# Patient Record
Sex: Female | Born: 1980 | Race: Black or African American | Hispanic: No | Marital: Single | Smoking: Never smoker
Health system: Southern US, Community
[De-identification: ages and names within clinical notes are randomized; demographics above are authoritative.]

## PROBLEM LIST (undated history)

## (undated) DIAGNOSIS — Z973 Presence of spectacles and contact lenses: Secondary | ICD-10-CM

## (undated) DIAGNOSIS — Z87410 Personal history of cervical dysplasia: Secondary | ICD-10-CM

## (undated) DIAGNOSIS — N920 Excessive and frequent menstruation with regular cycle: Secondary | ICD-10-CM

## (undated) DIAGNOSIS — D649 Anemia, unspecified: Secondary | ICD-10-CM

## (undated) DIAGNOSIS — D250 Submucous leiomyoma of uterus: Secondary | ICD-10-CM

## (undated) HISTORY — PX: DILATION AND CURETTAGE OF UTERUS: SHX78

---

## 2006-08-04 ENCOUNTER — Ambulatory Visit: Payer: Self-pay | Admitting: Family Medicine

## 2007-05-23 ENCOUNTER — Ambulatory Visit (HOSPITAL_COMMUNITY): Admission: RE | Admit: 2007-05-23 | Discharge: 2007-05-23 | Payer: Self-pay | Admitting: Obstetrics & Gynecology

## 2007-05-25 ENCOUNTER — Encounter: Payer: Self-pay | Admitting: Obstetrics & Gynecology

## 2007-05-25 ENCOUNTER — Ambulatory Visit (HOSPITAL_COMMUNITY): Admission: AD | Admit: 2007-05-25 | Discharge: 2007-05-25 | Payer: Self-pay | Admitting: Obstetrics

## 2007-08-04 ENCOUNTER — Ambulatory Visit (HOSPITAL_COMMUNITY): Admission: RE | Admit: 2007-08-04 | Discharge: 2007-08-04 | Payer: Self-pay | Admitting: Obstetrics & Gynecology

## 2007-10-12 ENCOUNTER — Ambulatory Visit (HOSPITAL_COMMUNITY): Admission: RE | Admit: 2007-10-12 | Discharge: 2007-10-12 | Payer: Self-pay | Admitting: Obstetrics & Gynecology

## 2008-03-10 ENCOUNTER — Inpatient Hospital Stay (HOSPITAL_COMMUNITY): Admission: AD | Admit: 2008-03-10 | Discharge: 2008-03-12 | Payer: Self-pay | Admitting: Obstetrics & Gynecology

## 2010-12-30 NOTE — Op Note (Signed)
NAMECHAUNICE, OBIE NO.:  [id number]   MEDICAL RECORD NO.:  [id number]          PATIENT TYPE:  AMB   LOCATION:  SDC                           FACILITY:  [facility]   PHYSICIAN:  Roseanna Rainbow, M.D.DATE OF BIRTH:  [birth year]   DATE OF PROCEDURE:  05/25/2007  DATE OF DISCHARGE:                               OPERATIVE REPORT   PREOPERATIVE DIAGNOSIS:  Incomplete abortion.   POSTOPERATIVE DIAGNOSIS:  Incomplete abortion.   PROCEDURE:  Suction dilatation and curettage.   SURGEON:  Antionette Char, M.D.   ANESTHESIA:  Managed anesthesia care, paracervical block.   Estimated blood loss 50 mL.   COMPLICATIONS:  None.   PATHOLOGY:  Products of conception.   PROCEDURE:  The patient was taken to the operating room with an IV  running.  She was placed in the dorsal lithotomy position and prepped  and draped in the usual sterile fashion.  After a time-out had been  completed, a bivalve speculum was placed in the patient's vagina.  The  anterior lip of the cervix was infiltrated with 2 mL of 1% lidocaine.  A  single-tooth tenaculum was then applied to this location.  4 mL of 1%  lidocaine were then injected at 4 and 7 o'clock to produce a  paracervical block.  The cervix was dilated.  An 7-mm suction curette  was then advanced into the uterine fundus.  The curette was activated  and rotated to evacuate the products of conception.  A sharp curettage  was performed.  A gritty texture was noted.  There were some  irregularities palpable at the time of sharp curettage consistent with  likely submucous myomas.  A final pass was made with the suction  curette.  After the single-tooth tenaculum was removed.  There was  bleeding noted from the tenaculum site on the cervix.  One area did not  respond to the application of silver nitrate and pressure.  Figure-of-  eight suture of 3-0 chromic was then placed.  Adequate hemostasis was  noted.  The speculum was then  removed.  At the close of the procedure  the instrument and pack counts were said to be correct x2.  The patient  was taken to the PACU awake and in stable condition.      Roseanna Rainbow, M.D.  Electronically Signed     LAJ/MEDQ  D:  05/25/2007  T:  05/26/2007  Job:  [id number]

## 2011-05-15 LAB — CBC
HCT: 28.4 — ABNORMAL LOW
HCT: 37.2
Hemoglobin: 12.9
Hemoglobin: 9.8 — ABNORMAL LOW
MCV: 100.8 — ABNORMAL HIGH
Platelets: 146 — ABNORMAL LOW
Platelets: 175
WBC: 15 — ABNORMAL HIGH

## 2011-05-15 LAB — RPR: RPR Ser Ql: NONREACTIVE

## 2011-05-28 LAB — CBC
MCHC: 34.9
Platelets: 253
RDW: 12.9

## 2012-12-05 ENCOUNTER — Other Ambulatory Visit (HOSPITAL_COMMUNITY)
Admission: RE | Admit: 2012-12-05 | Discharge: 2012-12-05 | Disposition: A | Discharge status: Home / Self Care | Payer: No Typology Code available for payment source | Source: Ambulatory Visit | Attending: Family Medicine | Admitting: Family Medicine

## 2012-12-05 ENCOUNTER — Other Ambulatory Visit: Payer: Self-pay | Admitting: Family Medicine

## 2012-12-05 DIAGNOSIS — Z113 Encounter for screening for infections with a predominantly sexual mode of transmission: Secondary | ICD-10-CM | POA: Insufficient documentation

## 2012-12-05 DIAGNOSIS — Z1151 Encounter for screening for human papillomavirus (HPV): Secondary | ICD-10-CM | POA: Insufficient documentation

## 2012-12-05 DIAGNOSIS — Z1231 Encounter for screening mammogram for malignant neoplasm of breast: Secondary | ICD-10-CM

## 2012-12-05 DIAGNOSIS — Z803 Family history of malignant neoplasm of breast: Secondary | ICD-10-CM

## 2012-12-05 DIAGNOSIS — Z124 Encounter for screening for malignant neoplasm of cervix: Secondary | ICD-10-CM | POA: Insufficient documentation

## 2012-12-09 ENCOUNTER — Ambulatory Visit
Admission: RE | Admit: 2012-12-09 | Discharge: 2012-12-09 | Disposition: A | Discharge status: Home / Self Care | Payer: No Typology Code available for payment source | Source: Ambulatory Visit | Attending: Family Medicine | Admitting: Family Medicine

## 2012-12-09 DIAGNOSIS — Z1231 Encounter for screening mammogram for malignant neoplasm of breast: Secondary | ICD-10-CM

## 2012-12-09 DIAGNOSIS — Z803 Family history of malignant neoplasm of breast: Secondary | ICD-10-CM

## 2014-02-13 ENCOUNTER — Other Ambulatory Visit: Payer: Self-pay | Admitting: Family Medicine

## 2014-02-13 ENCOUNTER — Other Ambulatory Visit (HOSPITAL_COMMUNITY)
Admission: RE | Admit: 2014-02-13 | Discharge: 2014-02-13 | Disposition: A | Discharge status: Home / Self Care | Payer: No Typology Code available for payment source | Source: Ambulatory Visit | Attending: Family Medicine | Admitting: Family Medicine

## 2014-02-13 DIAGNOSIS — Z1151 Encounter for screening for human papillomavirus (HPV): Secondary | ICD-10-CM | POA: Insufficient documentation

## 2014-02-13 DIAGNOSIS — Z124 Encounter for screening for malignant neoplasm of cervix: Secondary | ICD-10-CM | POA: Insufficient documentation

## 2014-02-15 LAB — CYTOLOGY - PAP

## 2014-12-18 ENCOUNTER — Ambulatory Visit (INDEPENDENT_AMBULATORY_CARE_PROVIDER_SITE_OTHER): Payer: 59 | Admitting: Licensed Clinical Social Worker

## 2014-12-18 DIAGNOSIS — F4322 Adjustment disorder with anxiety: Secondary | ICD-10-CM | POA: Diagnosis not present

## 2014-12-25 ENCOUNTER — Ambulatory Visit (INDEPENDENT_AMBULATORY_CARE_PROVIDER_SITE_OTHER): Payer: 59 | Admitting: Licensed Clinical Social Worker

## 2014-12-25 DIAGNOSIS — F4323 Adjustment disorder with mixed anxiety and depressed mood: Secondary | ICD-10-CM | POA: Diagnosis not present

## 2015-01-08 ENCOUNTER — Ambulatory Visit (INDEPENDENT_AMBULATORY_CARE_PROVIDER_SITE_OTHER): Payer: 59 | Admitting: Licensed Clinical Social Worker

## 2015-01-08 DIAGNOSIS — F4322 Adjustment disorder with anxiety: Secondary | ICD-10-CM

## 2015-01-23 ENCOUNTER — Ambulatory Visit: Payer: 59 | Admitting: Licensed Clinical Social Worker

## 2015-01-28 ENCOUNTER — Ambulatory Visit: Payer: 59 | Admitting: Licensed Clinical Social Worker

## 2015-02-19 ENCOUNTER — Ambulatory Visit (INDEPENDENT_AMBULATORY_CARE_PROVIDER_SITE_OTHER): Payer: Commercial Indemnity | Admitting: Licensed Clinical Social Worker

## 2015-02-19 DIAGNOSIS — F4322 Adjustment disorder with anxiety: Secondary | ICD-10-CM | POA: Diagnosis not present

## 2015-05-08 ENCOUNTER — Other Ambulatory Visit: Payer: Self-pay

## 2015-05-08 DIAGNOSIS — Z803 Family history of malignant neoplasm of breast: Secondary | ICD-10-CM

## 2015-05-08 DIAGNOSIS — Z1231 Encounter for screening mammogram for malignant neoplasm of breast: Secondary | ICD-10-CM

## 2015-05-14 ENCOUNTER — Ambulatory Visit
Admission: RE | Admit: 2015-05-14 | Discharge: 2015-05-14 | Disposition: A | Discharge status: Home / Self Care | Payer: Commercial Indemnity | Source: Ambulatory Visit

## 2015-05-14 DIAGNOSIS — Z803 Family history of malignant neoplasm of breast: Secondary | ICD-10-CM

## 2015-05-14 DIAGNOSIS — Z1231 Encounter for screening mammogram for malignant neoplasm of breast: Secondary | ICD-10-CM

## 2015-05-16 ENCOUNTER — Other Ambulatory Visit: Payer: Self-pay | Admitting: Family Medicine

## 2015-05-16 DIAGNOSIS — R928 Other abnormal and inconclusive findings on diagnostic imaging of breast: Secondary | ICD-10-CM

## 2015-05-22 ENCOUNTER — Other Ambulatory Visit: Payer: Self-pay

## 2015-05-28 ENCOUNTER — Ambulatory Visit
Admission: RE | Admit: 2015-05-28 | Discharge: 2015-05-28 | Disposition: A | Discharge status: Home / Self Care | Payer: Managed Care, Other (non HMO) | Source: Ambulatory Visit | Attending: Family Medicine | Admitting: Family Medicine

## 2015-05-28 DIAGNOSIS — R928 Other abnormal and inconclusive findings on diagnostic imaging of breast: Secondary | ICD-10-CM

## 2015-09-10 ENCOUNTER — Other Ambulatory Visit: Payer: Self-pay | Admitting: Family Medicine

## 2015-09-11 ENCOUNTER — Other Ambulatory Visit: Payer: Self-pay | Admitting: Family Medicine

## 2015-09-11 DIAGNOSIS — N920 Excessive and frequent menstruation with regular cycle: Secondary | ICD-10-CM

## 2015-09-16 ENCOUNTER — Ambulatory Visit
Admission: RE | Admit: 2015-09-16 | Discharge: 2015-09-16 | Disposition: A | Discharge status: Home / Self Care | Payer: Managed Care, Other (non HMO) | Source: Ambulatory Visit | Attending: Family Medicine | Admitting: Family Medicine

## 2015-09-16 DIAGNOSIS — N920 Excessive and frequent menstruation with regular cycle: Secondary | ICD-10-CM

## 2016-07-13 ENCOUNTER — Other Ambulatory Visit: Payer: Self-pay | Admitting: Family Medicine

## 2016-07-13 DIAGNOSIS — Z1231 Encounter for screening mammogram for malignant neoplasm of breast: Secondary | ICD-10-CM

## 2016-07-21 ENCOUNTER — Ambulatory Visit
Admission: RE | Admit: 2016-07-21 | Discharge: 2016-07-21 | Disposition: A | Discharge status: Home / Self Care | Payer: Managed Care, Other (non HMO) | Source: Ambulatory Visit | Attending: Family Medicine | Admitting: Family Medicine

## 2016-07-21 DIAGNOSIS — Z1231 Encounter for screening mammogram for malignant neoplasm of breast: Secondary | ICD-10-CM

## 2017-05-05 ENCOUNTER — Other Ambulatory Visit (HOSPITAL_COMMUNITY)
Admission: RE | Admit: 2017-05-05 | Discharge: 2017-05-05 | Disposition: A | Discharge status: Home / Self Care | Payer: Managed Care, Other (non HMO) | Source: Ambulatory Visit | Attending: Family Medicine | Admitting: Family Medicine

## 2017-05-05 ENCOUNTER — Other Ambulatory Visit: Payer: Self-pay | Admitting: Family Medicine

## 2017-05-05 DIAGNOSIS — Z124 Encounter for screening for malignant neoplasm of cervix: Secondary | ICD-10-CM | POA: Diagnosis not present

## 2017-05-07 LAB — CYTOLOGY - PAP
Adequacy: ABSENT
Chlamydia: NEGATIVE
Diagnosis: NEGATIVE
HPV: DETECTED — AB
Neisseria Gonorrhea: NEGATIVE

## 2018-08-08 ENCOUNTER — Other Ambulatory Visit (HOSPITAL_COMMUNITY)
Admission: RE | Admit: 2018-08-08 | Discharge: 2018-08-08 | Disposition: A | Discharge status: Home / Self Care | Payer: Managed Care, Other (non HMO) | Source: Ambulatory Visit | Attending: Obstetrics and Gynecology | Admitting: Obstetrics and Gynecology

## 2018-08-08 ENCOUNTER — Other Ambulatory Visit: Payer: Self-pay | Admitting: Obstetrics and Gynecology

## 2018-08-08 DIAGNOSIS — Z01411 Encounter for gynecological examination (general) (routine) with abnormal findings: Secondary | ICD-10-CM | POA: Insufficient documentation

## 2018-08-12 LAB — CYTOLOGY - PAP
DIAGNOSIS: NEGATIVE
Diagnosis: REACTIVE
HPV (WINDOPATH): NOT DETECTED

## 2019-01-03 ENCOUNTER — Other Ambulatory Visit: Payer: Self-pay | Admitting: Family Medicine

## 2019-03-07 ENCOUNTER — Ambulatory Visit: Payer: Managed Care, Other (non HMO) | Admitting: Family Medicine

## 2019-03-07 ENCOUNTER — Other Ambulatory Visit: Payer: Self-pay

## 2019-03-07 DIAGNOSIS — Z0289 Encounter for other administrative examinations: Secondary | ICD-10-CM

## 2019-03-09 ENCOUNTER — Other Ambulatory Visit: Payer: Self-pay

## 2019-03-09 ENCOUNTER — Ambulatory Visit (INDEPENDENT_AMBULATORY_CARE_PROVIDER_SITE_OTHER): Payer: Managed Care, Other (non HMO) | Admitting: Family Medicine

## 2019-03-09 ENCOUNTER — Encounter: Payer: Self-pay | Admitting: Family Medicine

## 2019-03-09 DIAGNOSIS — Z7689 Persons encountering health services in other specified circumstances: Secondary | ICD-10-CM

## 2019-03-09 DIAGNOSIS — L669 Cicatricial alopecia, unspecified: Secondary | ICD-10-CM | POA: Diagnosis not present

## 2019-03-09 DIAGNOSIS — D219 Benign neoplasm of connective and other soft tissue, unspecified: Secondary | ICD-10-CM

## 2019-03-09 DIAGNOSIS — E7800 Pure hypercholesterolemia, unspecified: Secondary | ICD-10-CM

## 2019-03-09 NOTE — Progress Notes (Signed)
Virtual Visit via Video Note  I connected with Dominique Hancock on 03/09/19 at  2:00 PM EDT by a video enabled telemedicine application and verified that I am speaking with the correct person using two identifiers.  Location patient: home Location provider:work or home office Persons participating in the virtual visit: patient, provider  I discussed the limitations of evaluation and management by telemedicine and the availability of in person appointments. The patient expressed understanding and agreed to proceed.   HPI: Pt is a 38 yo female with pmh sig for alopecia and fibroids.  Pt was previously seen at [facility]    Fibroids -seen by Dr. 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 -seen by Dermatolgy, Dr. Leonie Green at [facility] -topical ointment and steroid injections  Allergies: NKDA  Past Surgical hx: D&C 2008 Root canal  Social Hx: Pt works in [employer].  Has 2 kids ages 55 and 37.  Pt's 83 yo has a birthday this wknd.  Pt denies tobacco and drug use.  Pt endorses EtOH use.  Family Hx: Mom-breast cancer dx'd at 41,  PreDM, HTN Maternal aunt- triple negative breast cancer MGM-DM MGF- testicular cancer, HTN  ROS: See pertinent positives and negatives per HPI.  No past medical history on file.  No family history on file.  No current outpatient medications on file.  EXAM:  VITALS per patient if applicable: RR between 46-50 bpm  GENERAL: alert, oriented, appears well and in no acute distress  HEENT: atraumatic, conjunctiva clear, no obvious abnormalities on inspection of external nose and ears  NECK: normal movements of the head and neck  LUNGS: on inspection no signs of respiratory distress, breathing rate appears normal, no obvious gross SOB, gasping or  wheezing  CV: no obvious cyanosis  MS: moves all visible extremities without noticeable abnormality  PSYCH/NEURO: pleasant and cooperative, no obvious depression or anxiety, speech and thought processing grossly intact  ASSESSMENT AND PLAN:  Discussed the following assessment and plan:  Fibroids  -continue iron supplements during menses -pt encouraged to f/u with OB/Gyn, Dr. Garwin Brothers  Elevated cholesterol -discussed lifestyle modification -will check cholesterol at next OFV  Central centrifugal scarring alopecia  -continue current treatments with topical cream and steroid injections -continue f/u with Dr. Leonie Green, [facility]  Encounter to establish care  -We reviewed the PMH, PSH, FH, SH, Meds and Allergies. -We provided refills for any medications we will prescribe as needed. -We addressed current concerns per orders and patient instructions. -We have asked for records for pertinent exams, studies, vaccines and notes from previous providers. -We have advised patient to follow up per instructions below.  F/u prn for CPE in the next few months I discussed the assessment and treatment plan with the patient. The patient was provided an opportunity to ask questions and all were answered. The patient agreed with the plan and demonstrated an understanding of the instructions.   The patient was advised to call back or seek an in-person evaluation if the symptoms worsen or if the condition fails to improve as anticipated.   Billie Ruddy, MD

## 2019-03-15 ENCOUNTER — Other Ambulatory Visit: Payer: Self-pay | Admitting: Obstetrics and Gynecology

## 2019-04-13 ENCOUNTER — Other Ambulatory Visit: Payer: Self-pay

## 2019-04-13 ENCOUNTER — Encounter (HOSPITAL_COMMUNITY)
Admission: RE | Admit: 2019-04-13 | Discharge: 2019-04-13 | Disposition: A | Discharge status: Home / Self Care | Payer: Managed Care, Other (non HMO) | Source: Ambulatory Visit | Attending: Obstetrics and Gynecology | Admitting: Obstetrics and Gynecology

## 2019-04-13 ENCOUNTER — Other Ambulatory Visit (HOSPITAL_COMMUNITY)
Admission: RE | Admit: 2019-04-13 | Discharge: 2019-04-13 | Disposition: A | Discharge status: Home / Self Care | Payer: Managed Care, Other (non HMO) | Source: Ambulatory Visit | Attending: Obstetrics and Gynecology | Admitting: Obstetrics and Gynecology

## 2019-04-13 ENCOUNTER — Encounter (HOSPITAL_BASED_OUTPATIENT_CLINIC_OR_DEPARTMENT_OTHER): Payer: Self-pay | Admitting: *Deleted

## 2019-04-13 DIAGNOSIS — Z20828 Contact with and (suspected) exposure to other viral communicable diseases: Secondary | ICD-10-CM | POA: Diagnosis not present

## 2019-04-13 DIAGNOSIS — N920 Excessive and frequent menstruation with regular cycle: Secondary | ICD-10-CM | POA: Diagnosis present

## 2019-04-13 DIAGNOSIS — D250 Submucous leiomyoma of uterus: Secondary | ICD-10-CM | POA: Diagnosis present

## 2019-04-13 DIAGNOSIS — N840 Polyp of corpus uteri: Secondary | ICD-10-CM | POA: Diagnosis not present

## 2019-04-13 DIAGNOSIS — Z01812 Encounter for preprocedural laboratory examination: Secondary | ICD-10-CM | POA: Insufficient documentation

## 2019-04-13 LAB — CBC
HCT: 32.2 % — ABNORMAL LOW (ref 36.0–46.0)
Hemoglobin: 9.9 g/dL — ABNORMAL LOW (ref 12.0–15.0)
MCH: 26.8 pg (ref 26.0–34.0)
MCHC: 30.7 g/dL (ref 30.0–36.0)
MCV: 87.3 fL (ref 80.0–100.0)
Platelets: 303 10*3/uL (ref 150–400)
RBC: 3.69 MIL/uL — ABNORMAL LOW (ref 3.87–5.11)
RDW: 16.3 % — ABNORMAL HIGH (ref 11.5–15.5)
WBC: 8.8 10*3/uL (ref 4.0–10.5)
nRBC: 0 % (ref 0.0–0.2)

## 2019-04-13 LAB — SARS CORONAVIRUS 2 (TAT 6-24 HRS): SARS Coronavirus 2: NEGATIVE

## 2019-04-13 NOTE — Progress Notes (Signed)
Spoke w/ pt via phone for pre-op interview.  Npo after mn w/ exception clear liquids until 0730 then nothing by mouth, pt verbalized understanding.  Arrive at [facility].  Needs urine preg.  Current cbc result dated 04-13-2019 in chart and epic.

## 2019-04-17 ENCOUNTER — Ambulatory Visit (HOSPITAL_BASED_OUTPATIENT_CLINIC_OR_DEPARTMENT_OTHER): Payer: Managed Care, Other (non HMO) | Admitting: Anesthesiology

## 2019-04-17 ENCOUNTER — Other Ambulatory Visit: Payer: Self-pay

## 2019-04-17 ENCOUNTER — Ambulatory Visit (HOSPITAL_BASED_OUTPATIENT_CLINIC_OR_DEPARTMENT_OTHER)
Admission: RE | Admit: 2019-04-17 | Discharge: 2019-04-17 | Disposition: A | Discharge status: Home / Self Care | Payer: Managed Care, Other (non HMO) | Attending: Obstetrics and Gynecology | Admitting: Obstetrics and Gynecology

## 2019-04-17 ENCOUNTER — Encounter (HOSPITAL_BASED_OUTPATIENT_CLINIC_OR_DEPARTMENT_OTHER): Payer: Self-pay | Admitting: *Deleted

## 2019-04-17 ENCOUNTER — Encounter (HOSPITAL_BASED_OUTPATIENT_CLINIC_OR_DEPARTMENT_OTHER)
Admission: RE | Disposition: A | Discharge status: Home / Self Care | Payer: Self-pay | Source: Home / Self Care | Attending: Obstetrics and Gynecology

## 2019-04-17 DIAGNOSIS — N840 Polyp of corpus uteri: Secondary | ICD-10-CM | POA: Insufficient documentation

## 2019-04-17 DIAGNOSIS — N920 Excessive and frequent menstruation with regular cycle: Secondary | ICD-10-CM | POA: Diagnosis not present

## 2019-04-17 DIAGNOSIS — D250 Submucous leiomyoma of uterus: Secondary | ICD-10-CM | POA: Insufficient documentation

## 2019-04-17 HISTORY — DX: Anemia, unspecified: D64.9

## 2019-04-17 HISTORY — DX: Submucous leiomyoma of uterus: D25.0

## 2019-04-17 HISTORY — DX: Excessive and frequent menstruation with regular cycle: N92.0

## 2019-04-17 HISTORY — DX: Presence of spectacles and contact lenses: Z97.3

## 2019-04-17 HISTORY — PX: DILATATION & CURETTAGE/HYSTEROSCOPY WITH MYOSURE: SHX6511

## 2019-04-17 LAB — POCT PREGNANCY, URINE: Preg Test, Ur: NEGATIVE

## 2019-04-17 SURGERY — DILATATION & CURETTAGE/HYSTEROSCOPY WITH MYOSURE
Anesthesia: General

## 2019-04-17 MED ORDER — FENTANYL CITRATE (PF) 100 MCG/2ML IJ SOLN
INTRAMUSCULAR | Status: AC
  Filled 2019-04-17: qty 2

## 2019-04-17 MED ORDER — KETOROLAC TROMETHAMINE 30 MG/ML IJ SOLN
INTRAMUSCULAR | Status: DC | PRN
  Administered 2019-04-17: 30 mg via INTRAVENOUS

## 2019-04-17 MED ORDER — IBUPROFEN 800 MG PO TABS: 800.0000 mg | ORAL_TABLET | Freq: Three times a day (TID) | ORAL | 0 refills | Status: DC | PRN

## 2019-04-17 MED ORDER — MIDAZOLAM HCL 2 MG/2ML IJ SOLN
INTRAMUSCULAR | Status: AC
  Filled 2019-04-17: qty 2

## 2019-04-17 MED ORDER — OXYCODONE HCL 5 MG/5ML PO SOLN
5.0000 mg | Freq: Once | ORAL | Status: AC | PRN
  Filled 2019-04-17: qty 5

## 2019-04-17 MED ORDER — FENTANYL CITRATE (PF) 100 MCG/2ML IJ SOLN
INTRAMUSCULAR | Status: DC | PRN
  Administered 2019-04-17: 25 ug via INTRAVENOUS
  Administered 2019-04-17: 50 ug via INTRAVENOUS
  Administered 2019-04-17: 25 ug via INTRAVENOUS

## 2019-04-17 MED ORDER — PROMETHAZINE HCL 25 MG/ML IJ SOLN
6.2500 mg | INTRAMUSCULAR | Status: DC | PRN
  Filled 2019-04-17: qty 1

## 2019-04-17 MED ORDER — KETOROLAC TROMETHAMINE 30 MG/ML IJ SOLN
INTRAMUSCULAR | Status: AC
  Filled 2019-04-17: qty 1

## 2019-04-17 MED ORDER — DEXAMETHASONE SODIUM PHOSPHATE 4 MG/ML IJ SOLN
INTRAMUSCULAR | Status: DC | PRN
  Administered 2019-04-17: 5 mg via INTRAVENOUS

## 2019-04-17 MED ORDER — SODIUM CHLORIDE 0.9 % IR SOLN
Status: DC | PRN
  Administered 2019-04-17: 3000 mL

## 2019-04-17 MED ORDER — PROPOFOL 10 MG/ML IV BOLUS
INTRAVENOUS | Status: AC
  Filled 2019-04-17: qty 20

## 2019-04-17 MED ORDER — ONDANSETRON HCL 4 MG/2ML IJ SOLN
INTRAMUSCULAR | Status: DC | PRN
  Administered 2019-04-17: 4 mg via INTRAVENOUS

## 2019-04-17 MED ORDER — OXYCODONE HCL 5 MG PO TABS
5.0000 mg | ORAL_TABLET | Freq: Once | ORAL | Status: AC | PRN
  Administered 2019-04-17: 5 mg via ORAL
  Filled 2019-04-17: qty 1

## 2019-04-17 MED ORDER — LACTATED RINGERS IV SOLN
INTRAVENOUS | Status: DC
  Administered 2019-04-17: 13:00:00 via INTRAVENOUS
  Filled 2019-04-17: qty 1000

## 2019-04-17 MED ORDER — OXYCODONE HCL 5 MG PO TABS
ORAL_TABLET | ORAL | Status: AC
  Filled 2019-04-17: qty 1

## 2019-04-17 MED ORDER — DEXAMETHASONE SODIUM PHOSPHATE 10 MG/ML IJ SOLN
INTRAMUSCULAR | Status: AC
  Filled 2019-04-17: qty 1

## 2019-04-17 MED ORDER — KETOROLAC TROMETHAMINE 30 MG/ML IJ SOLN
30.0000 mg | Freq: Once | INTRAMUSCULAR | Status: DC | PRN
  Filled 2019-04-17: qty 1

## 2019-04-17 MED ORDER — FENTANYL CITRATE (PF) 100 MCG/2ML IJ SOLN
25.0000 ug | INTRAMUSCULAR | Status: DC | PRN
  Filled 2019-04-17: qty 1

## 2019-04-17 MED ORDER — LIDOCAINE HCL 1 % IJ SOLN
INTRAMUSCULAR | Status: DC | PRN
  Administered 2019-04-17: 40 mg via INTRADERMAL

## 2019-04-17 MED ORDER — LIDOCAINE 2% (20 MG/ML) 5 ML SYRINGE
INTRAMUSCULAR | Status: AC
  Filled 2019-04-17: qty 5

## 2019-04-17 MED ORDER — MIDAZOLAM HCL 5 MG/5ML IJ SOLN
INTRAMUSCULAR | Status: DC | PRN
  Administered 2019-04-17: 2 mg via INTRAVENOUS

## 2019-04-17 MED ORDER — PROPOFOL 10 MG/ML IV BOLUS
INTRAVENOUS | Status: DC | PRN
  Administered 2019-04-17: 180 mg via INTRAVENOUS

## 2019-04-17 SURGICAL SUPPLY — 19 items
CANISTER SUCT 3000ML PPV (MISCELLANEOUS) ×2 IMPLANT
CATH ROBINSON RED A/P 16FR (CATHETERS) ×1 IMPLANT
COVER WAND RF STERILE (DRAPES) ×2 IMPLANT
GAUZE 4X4 16PLY RFD (DISPOSABLE) ×2 IMPLANT
GLOVE BIOGEL PI IND STRL 7.0 (GLOVE) ×1 IMPLANT
GLOVE BIOGEL PI INDICATOR 7.0 (GLOVE) ×1
GLOVE ECLIPSE 6.5 STRL STRAW (GLOVE) ×2 IMPLANT
GOWN STRL REUS W/TWL LRG LVL3 (GOWN DISPOSABLE) ×2 IMPLANT
IV NS IRRIG 3000ML ARTHROMATIC (IV SOLUTION) ×2 IMPLANT
KIT PROCEDURE FLUENT (KITS) ×2 IMPLANT
KIT TURNOVER CYSTO (KITS) ×2 IMPLANT
MYOSURE XL FIBROID (MISCELLANEOUS) ×2
PACK VAGINAL MINOR WOMEN LF (CUSTOM PROCEDURE TRAY) ×2 IMPLANT
PAD OB MATERNITY 4.3X12.25 (PERSONAL CARE ITEMS) ×2 IMPLANT
PAD PREP 24X48 CUFFED NSTRL (MISCELLANEOUS) ×2 IMPLANT
SEAL ROD LENS SCOPE MYOSURE (ABLATOR) ×2 IMPLANT
SYSTEM TISS REMOVAL MYOSURE XL (MISCELLANEOUS) IMPLANT
TOWEL OR 17X26 10 PK STRL BLUE (TOWEL DISPOSABLE) ×2 IMPLANT
WATER STERILE IRR 500ML POUR (IV SOLUTION) ×2 IMPLANT

## 2019-04-17 NOTE — Anesthesia Procedure Notes (Signed)
Procedure Name: LMA Insertion Date/Time: 04/17/2019 1:35 PM Performed by: Garrel Ridgel, CRNA Pre-anesthesia Checklist: Patient identified, Emergency Drugs available, Suction available, Patient being monitored and Timeout performed Patient Re-evaluated:Patient Re-evaluated prior to induction Oxygen Delivery Method: Circle system utilized Preoxygenation: Pre-oxygenation with 100% oxygen Induction Type: IV induction Ventilation: Mask ventilation without difficulty LMA: LMA inserted LMA Size: 3.0 Number of attempts: 1 Dental Injury: Teeth and Oropharynx as per pre-operative assessment

## 2019-04-17 NOTE — Transfer of Care (Signed)
Immediate Anesthesia Transfer of Care Note  Patient: Dominique Hancock  Procedure(s) Performed: DILATATION & CURETTAGE/HYSTEROSCOPY WITH MYOSURE XL (N/A )  Patient Location: PACU  Anesthesia Type:General  Level of Consciousness: awake, alert , oriented and patient cooperative  Airway & Oxygen Therapy: Patient Spontanous Breathing and Patient connected to nasal cannula oxygen  Post-op Assessment: Report given to RN and Post -op Vital signs reviewed and stable  Post vital signs: Reviewed and stable  Last Vitals:  Vitals Value Taken Time  BP 106/62 04/17/19 1415  Temp    Pulse 67 04/17/19 1415  Resp 12 04/17/19 1415  SpO2 100 % 04/17/19 1415  Vitals shown include unvalidated device data.  Last Pain:  Vitals:   04/17/19 1136  TempSrc: Oral         Complications: No apparent anesthesia complications

## 2019-04-17 NOTE — Anesthesia Preprocedure Evaluation (Signed)
Anesthesia Evaluation  Patient identified by MRN, date of birth, ID band Patient awake    Reviewed: Allergy & Precautions, NPO status , Patient's Chart, lab work & pertinent test results  Airway Mallampati: II  TM Distance: >3 FB Neck ROM: Full    Dental no notable dental hx.    Pulmonary neg pulmonary ROS,    Pulmonary exam normal breath sounds clear to auscultation       Cardiovascular negative cardio ROS Normal cardiovascular exam Rhythm:Regular Rate:Normal     Neuro/Psych negative neurological ROS  negative psych ROS   GI/Hepatic negative GI ROS, Neg liver ROS,   Endo/Other  negative endocrine ROS  Renal/GU negative Renal ROS  negative genitourinary   Musculoskeletal negative musculoskeletal ROS (+)   Abdominal   Peds negative pediatric ROS (+)  Hematology negative hematology ROS (+) anemia ,   Anesthesia Other Findings   Reproductive/Obstetrics negative OB ROS                             Anesthesia Physical Anesthesia Plan  ASA: II  Anesthesia Plan: General   Post-op Pain Management:    Induction: Intravenous  PONV Risk Score and Plan: 3 and Ondansetron, Dexamethasone and Treatment may vary due to age or medical condition  Airway Management Planned: LMA  Additional Equipment:   Intra-op Plan:   Post-operative Plan: Extubation in OR  Informed Consent: I have reviewed the patients History and Physical, chart, labs and discussed the procedure including the risks, benefits and alternatives for the proposed anesthesia with the patient or authorized representative who has indicated his/her understanding and acceptance.     Dental advisory given  Plan Discussed with: CRNA and Surgeon  Anesthesia Plan Comments:         Anesthesia Quick Evaluation

## 2019-04-17 NOTE — Discharge Instructions (Signed)
DISCHARGE INSTRUCTIONS: HYSTEROSCOPY  The following instructions have been prepared to help you care for yourself upon your return home.    May take Ibuprofen after 8:00pm today.    Personal hygiene:  Use sanitary pads for vaginal drainage, not tampons.  Shower the day after your procedure.  NO tub baths, pools or Jacuzzis for 2-3 weeks.  Wipe front to back after using the bathroom.  Activity and limitations:  Do NOT drive or operate any equipment for 24 hours. The effects of anesthesia are still present and drowsiness may result.  Do NOT rest in bed all day.  Walking is encouraged.  Walk up and down stairs slowly.  You may resume your normal activity in one to two days or as indicated by your physician. Sexual activity: NO intercourse for at least 2 weeks after the procedure, or as indicated by your Doctor.  Diet: Eat a light meal as desired this evening. You may resume your usual diet tomorrow.  Return to Work: You may resume your work activities in one to two days or as indicated by Marine scientist.  What to expect after your surgery: Expect to have vaginal bleeding/discharge for 2-3 days and spotting for up to 10 days. It is not unusual to have soreness for up to 1-2 weeks. You may have a slight burning sensation when you urinate for the first day. Mild cramps may continue for a couple of days. You may have a regular period in 2-6 weeks.  Call your doctor for any of the following:  Excessive vaginal bleeding or clotting, saturating and changing one pad every hour.  Inability to urinate 6 hours after discharge from hospital.  Pain not relieved by pain medication.  Fever of 100.4 F or greater.  Unusual vaginal discharge or odor.  Return to office _________________Call for an appointment ___________________ Patients signature: ______________________ Nurses signature ________________________    Post Anesthesia Home Care Instructions  Activity: Get  plenty of rest for the remainder of the day. A responsible individual must stay with you for 24 hours following the procedure.  For the next 24 hours, DO NOT: -Drive a car -Paediatric nurse -Drink alcoholic beverages -Take any medication unless instructed by your physician -Make any legal decisions or sign important papers.  Meals: Start with liquid foods such as gelatin or soup. Progress to regular foods as tolerated. Avoid greasy, spicy, heavy foods. If nausea and/or vomiting occur, drink only clear liquids until the nausea and/or vomiting subsides. Call your physician if vomiting continues.  Special Instructions/Symptoms: Your throat may feel dry or sore from the anesthesia or the breathing tube placed in your throat during surgery. If this causes discomfort, gargle with warm salt water. The discomfort should disappear within 24 hours.  If you had a scopolamine patch placed behind your ear for the management of post- operative nausea and/or vomiting:  1. The medication in the patch is effective for 72 hours, after which it should be removed.  Wrap patch in a tissue and discard in the trash. Wash hands thoroughly with soap and water. 2. You may remove the patch earlier than 72 hours if you experience unpleasant side effects which may include dry mouth, dizziness or visual disturbances. 3. Avoid touching the patch. Wash your hands with soap and water after contact with the patch.

## 2019-04-17 NOTE — Brief Op Note (Signed)
04/17/2019  2:23 PM  PATIENT:  Dominique Hancock  38 y.o. female  PRE-OPERATIVE DIAGNOSIS:  Submucosal Fibroid, Menorrhagia  POST-OPERATIVE DIAGNOSIS:  Submucosal Fibroid, Menorrhagia  PROCEDURE:  Diagnostic hysteroscopy, dilation and curettage, hysteroscopic resection of SM fibroid  SURGEON:  Surgeon(s) and Role:    Servando Salina, MD - Primary  PHYSICIAN ASSISTANT:   ASSISTANTS: none   ANESTHESIA:   general FINDINGS; larger posterior LUS SM fibroid, pea size right ant SM fibroid, ? endom polyps, tubal ostia seen EBL:  5 mL   BLOOD ADMINISTERED:none  DRAINS: none   LOCAL MEDICATIONS USED:  NONE  SPECIMEN:  Source of Specimen:  emc with fibroid resection  DISPOSITION OF SPECIMEN:  PATHOLOGY  COUNTS:  YES  TOURNIQUET:  * No tourniquets in log *  DICTATION: .Other Dictation: Dictation Number [id number]  PLAN OF CARE: Discharge to home after PACU  PATIENT DISPOSITION:  PACU - hemodynamically stable.   Delay start of Pharmacological VTE agent (>24hrs) due to surgical blood loss or risk of bleeding: no

## 2019-04-17 NOTE — Anesthesia Postprocedure Evaluation (Signed)
Anesthesia Post Note  Patient: Dominique Hancock  Procedure(s) Performed: DILATATION & CURETTAGE/HYSTEROSCOPY WITH MYOSURE XL (N/A )     Patient location during evaluation: PACU Anesthesia Type: General Level of consciousness: awake and alert Pain management: pain level controlled Vital Signs Assessment: post-procedure vital signs reviewed and stable Respiratory status: spontaneous breathing, nonlabored ventilation, respiratory function stable and patient connected to nasal cannula oxygen Cardiovascular status: blood pressure returned to baseline and stable Postop Assessment: no apparent nausea or vomiting Anesthetic complications: no    Last Vitals:  Vitals:   04/17/19 1136 04/17/19 1415  BP: (!) 109/58 106/62  Pulse: 74 73  Resp: 16 12  Temp: 36.8 C (!) 36.4 C  SpO2: 100% 100%    Last Pain:  Vitals:   04/17/19 1415  TempSrc:   PainSc: 0-No pain                 Timea Breed S

## 2019-04-17 NOTE — H&P (Addendum)
Dominique Hancock is an 38 y.o. female G6 P2 BF presents for surgical management of SM fibroid noted on sonohysterogram done for menorrhagia  Pertinent Gynecological History: Menses: flow is excessive with use of 7 pads or tampons on heaviest days Bleeding: dysfunctional uterine bleeding Contraception: rhythm method DES exposure: denies Blood transfusions: none Sexually transmitted diseases: no past history Previous GYN Procedures: DNC  Last mammogram: n/a Date: n/a Last pap: normal Date: 08/08/2018 OB History: G6, P2   Menstrual History: Menarche age: n/a Patient's last menstrual period was 03/29/2019 (approximate).    Past Medical History:  Diagnosis Date  . Anemia   . Menorrhagia   . Submucous uterine fibroid   . Wears glasses     Past Surgical History:  Procedure Laterality Date  . DILATION AND CURETTAGE OF UTERUS  05-25-2007  @[facility]     History reviewed. No pertinent family history.  Social History:  reports that she has never smoked. She has never used smokeless tobacco. She reports current alcohol use. She reports that she does not use drugs.  Allergies: No Known Allergies  No medications prior to admission.    Review of Systems  All other systems reviewed and are negative.   Height 5\' 5"  (1.651 m), weight 72.6 kg, last menstrual period 03/29/2019. Physical Exam  Constitutional: She is oriented to person, place, and time. She appears well-developed and well-nourished.  Eyes: EOM are normal.  Neck: Neck supple.  Cardiovascular: Regular rhythm.  Respiratory: Effort normal.  GI: Soft.  Genitourinary:    Vagina normal.     Genitourinary Comments: Cervix nl Uterus nodular sl enlarged Adnexa nl   Musculoskeletal: Normal range of motion.  Neurological: She is alert and oriented to person, place, and time.  Skin: Skin is warm and dry.  Psychiatric: She has a normal mood and affect.    No results found for this or any previous visit (from the past 24  hour(s)).  No results found.  Assessment/Plan: Menorrhagia with regular cycles SM fibroid P) dx hysteroscopy, D&C, resection of SM fibroid. Risk of surgery includes infection, bleeding, injury to surrounding organ structures, inability to complete resection in one setting, fluid overload and its mgmt and risk, uterine perforation ( 08/998) and its risk. All ? answered  Dominique Hancock A Dominique Hancock 04/17/2019, 6:25 AM    Up date  I have reexamined pt  no change since last visit

## 2019-04-18 ENCOUNTER — Encounter (HOSPITAL_BASED_OUTPATIENT_CLINIC_OR_DEPARTMENT_OTHER): Payer: Self-pay | Admitting: Obstetrics and Gynecology

## 2019-04-18 NOTE — Op Note (Signed)
Dominique Hancock, Dominique Hancock [facility] MEDICAL RECORD [id number] ACCOUNT [id number] DATE OF BIRTH:[birth year] FACILITY: [facility] LOCATION: [facility] PHYSICIAN:Bindu Docter [name], MD  OPERATIVE REPORT  DATE OF PROCEDURE:  04/17/2019  PREOPERATIVE DIAGNOSES:  Menorrhagia with regular cycles, submucosal fibroid.  PROCEDURE PERFORMED:  Diagnostic hysteroscopy, hysteroscopic resection of submucosal fibroid, dilation and curettage.  POSTOPERATIVE DIAGNOSIS:  Menorrhagia with submucosal fibroids.  ANESTHESIA:  General.  SURGEON:  Servando Salina, MD  ASSISTANT:  None.  DESCRIPTION OF PROCEDURE:  Under adequate general anesthesia, the patient was placed in the dorsal lithotomy position.  She was sterilely prepped and draped in the usual fashion.  The bladder was catheterized, a moderate amount of urine.  Examination  under anesthesia revealed an anteverted uterus.  No adnexal masses could be appreciated.  A bivalve speculum was placed in the vagina.  A single-tooth tenaculum was placed on the anterior lip of the cervix.  The cervix was dilated up to a #23 Pratt  dilator.  A diagnostic hysteroscope was introduced into the uterine cavity.  Both tubal ostia could be seen.  A small anterior submucosal fibroid was noted.  Two other possible endometrial polyps were noted.  The left lateral wall has endometrial thickening, and posteriorly was a large submucosal fibroid.  Using the MyoSure XL resectoscope, the smaller submucosal fibroid and the other polypoid lesions and thickening were removed, and then the submucosal fibroid was resected.  Three-quarters of  the resection was done when the fluid deficit of 2500 resulted in the procedure being stopped.  At that point, all instruments were then removed from the vagina.  SPECIMEN:  Endometrial curetting with fibroid resection sent to pathology.  ESTIMATED BLOOD LOSS:  10 mL.  FLUID DEFICIT:  2430 mL.  COUNTS:  Sponge and instrument count x2 was  correct.  COMPLICATIONS:  None.  DISPOSITION:  The patient tolerated the procedure well and was transferred to the recovery room in stable condition.  LN/NUANCE  D:04/17/2019 T:04/18/2019 JOB:[id number]

## 2019-08-16 ENCOUNTER — Ambulatory Visit: Payer: Managed Care, Other (non HMO) | Attending: Internal Medicine

## 2019-08-16 DIAGNOSIS — Z20822 Contact with and (suspected) exposure to covid-19: Secondary | ICD-10-CM

## 2019-08-17 LAB — NOVEL CORONAVIRUS, NAA: SARS-CoV-2, NAA: NOT DETECTED

## 2019-12-15 ENCOUNTER — Other Ambulatory Visit: Payer: Self-pay

## 2019-12-18 ENCOUNTER — Ambulatory Visit (INDEPENDENT_AMBULATORY_CARE_PROVIDER_SITE_OTHER): Payer: Managed Care, Other (non HMO) | Admitting: Family Medicine

## 2019-12-18 ENCOUNTER — Encounter: Payer: Self-pay | Admitting: Family Medicine

## 2019-12-18 VITALS — BP 108/64 | HR 88 | Temp 97.5°F | Wt 159.0 lb

## 2019-12-18 DIAGNOSIS — M25461 Effusion, right knee: Secondary | ICD-10-CM | POA: Diagnosis not present

## 2019-12-18 MED ORDER — IBUPROFEN 800 MG PO TABS: 800.0000 mg | ORAL_TABLET | Freq: Three times a day (TID) | ORAL | 0 refills | Status: DC | PRN

## 2019-12-18 NOTE — Progress Notes (Signed)
Subjective:    Patient ID: Dominique Hancock, female    DOB: March 22, 1981, 39 y.o.   MRN: [id number]  No chief complaint on file.   HPI Patient was seen today for ongoing concern.  Pt endorses R knee edema x 5-6 wks.  Pt does not recall injury.  Notes edema increases at the end of the day if does a lot of standing.  Pt states the edema/tight feeling has become worse in the last 2 wks.  Denies pain.  Tried RICE, ibuprofen 800 mg, and wearing a brace.  Notes h/o twisting knee 10 yrs ago.  Past Medical History:  Diagnosis Date  . Anemia   . Menorrhagia   . Submucous uterine fibroid   . Wears glasses     No Known Allergies  ROS General: Denies fever, chills, night sweats, changes in weight, changes in appetite HEENT: Denies headaches, ear pain, changes in vision, rhinorrhea, sore throat CV: Denies CP, palpitations, SOB, orthopnea Pulm: Denies SOB, cough, wheezing GI: Denies abdominal pain, nausea, vomiting, diarrhea, constipation GU: Denies dysuria, hematuria, frequency, vaginal discharge Msk: Denies muscle cramps, joint pains  +R knee edema Neuro: Denies weakness, numbness, tingling Skin: Denies rashes, bruising Psych: Denies depression, anxiety, hallucinations    Objective:    Blood pressure 108/64, pulse 88, temperature (!) 97.5 F (36.4 C), temperature source Temporal, weight 159 lb (72.1 kg), SpO2 98 %.  Gen. Pleasant, well-nourished, in no distress, normal affect   HEENT: [place]/AT, face symmetric, conjunctiva clear, no scleral icterus, PERRLA, EOMI, nares patent without drainage Lungs: no accessory muscle use, CTAB, no wheezes or rales Cardiovascular: RRR, no peripheral edema Musculoskeletal: R knee mildly edematous compared to L.  No crepitus on b/l knees.  Mild R knee effusion noted.  Normal patellar tracking.  No TTP of joint line, negative anterior drawer, Lachman's of b/l knees.  No deformities, no cyanosis or clubbing, normal tone Neuro:  A&Ox3, CN II-XII intact, normal  gait Skin:  Warm, no lesions/ rash   Wt Readings from Last 3 Encounters:  12/18/19 159 lb (72.1 kg)  04/17/19 165 lb (74.8 kg)    Lab Results  Component Value Date   WBC 8.8 04/13/2019   HGB 9.9 (L) 04/13/2019   HCT 32.2 (L) 04/13/2019   PLT 303 04/13/2019    Assessment/Plan:  Effusion of right knee  -discussed possible cartilage injury given h/o knee injury 10 yrs ago.  Less likely arthritis. -continue supportive care -given duration of symptoms will place referral to Ortho. - Plan: Ambulatory referral to Orthopedic Surgery, ibuprofen (ADVIL) 800 MG tablet  F/u prn  Grier Mitts, MD

## 2019-12-18 NOTE — Patient Instructions (Signed)
Knee Effusion Knee effusion refers to excess fluid in the knee joint. This can cause pain and swelling in your knee. Knee effusion creates more pressure than usual in your knee joint. This makes it more difficult for you to bend and move your knee. If there is fluid in your knee, it often means that something is wrong inside your knee. This can be a result of:  Severe arthritis.  Injury to the knee muscles, ligaments, or cartilage.  Infection.  Autoimmune disease. This means that your body's defense system (immune system) mistakenly attacks healthy body tissues. Follow these instructions at home: Medicines  Take over-the-counter and prescription medicines only as told by your health care provider.  Do not drive or use heavy machinery while taking prescription pain medicine.  If you are taking prescription pain medicine, take actions to prevent or treat constipation. Your health care provider may recommend that you: ? Drink enough fluid to keep your urine pale yellow. ? Eat foods that are high in fiber, such as fresh fruits and vegetables, whole grains, and beans. ? Limit foods that are high in fat and processed sugars, such as fried or sweet foods. ? Take an over-the-counter or prescription medicine for constipation. If you have a brace:  Wear the brace as told by your health care provider. Remove it only as told by your health care provider.  Loosen the brace if your toes tingle, become numb, or turn cold and blue.  Keep the brace clean.  If the brace is not waterproof: ? Do not let it get wet. ? Cover it with a watertight covering when you take a bath or a shower. Managing pain, stiffness, and swelling   If directed, put ice on the swollen area: ? If you have a removable brace, remove it as told by your health care provider. ? Put ice in a plastic bag. ? Place a towel between your skin and the bag. ? Leave the ice on for 20 minutes, 2-3 times per day.  Raise (elevate) your  knee at or above the level of your heart while you are sitting or lying down. General instructions  Do not use any products that contain nicotine or tobacco, such as cigarettes and e-cigarettes. These can delay healing. If you need help quitting, ask your health care provider.  Do not use the injured limb to support your body weight until your health care provider says it is okay. Use crutches as told by your health care provider.  Do any rehabilitation or strengthening exercises as told by your health care provider.  Rest as told by your health care provider. ? Avoid sitting for a long time without moving. Get up to take short walks every 1-2 hours. This is important to improve blood flow and breathing. Ask for help if you feel weak or unsteady.  Keep all follow-up visits as told by your health care provider. This is important. Contact a health care provider if you:  Continue to have pain in your knee. Get help right away if you:  Have swelling or redness of your knee that gets worse or does not get better.  Have severe pain in your knee.  Have a fever. Summary  Knee effusion refers to excess fluid in the knee joint. This causes pain and swelling and makes it difficult to bend and move your knee.  Effusion may be caused by severe arthritis, autoimmune disease, infection, or injury to the knee muscles, ligaments, or cartilage.  Take over-the-counter and  prescription medicines only as told by your health care provider.  If you have a brace, wear the brace as told by your health care provider. This information is not intended to replace advice given to you by your health care provider. Make sure you discuss any questions you have with your health care provider. Document Revised: 04/22/2018 Document Reviewed: 08/15/2017 Elsevier Patient Education  2020 Elsevier Inc.  

## 2019-12-27 ENCOUNTER — Other Ambulatory Visit: Payer: Self-pay | Admitting: Obstetrics and Gynecology

## 2019-12-27 DIAGNOSIS — D250 Submucous leiomyoma of uterus: Secondary | ICD-10-CM

## 2019-12-28 ENCOUNTER — Ambulatory Visit (INDEPENDENT_AMBULATORY_CARE_PROVIDER_SITE_OTHER): Payer: Managed Care, Other (non HMO) | Admitting: Orthopaedic Surgery

## 2019-12-28 ENCOUNTER — Encounter: Payer: Self-pay | Admitting: Orthopaedic Surgery

## 2019-12-28 ENCOUNTER — Other Ambulatory Visit: Payer: Self-pay

## 2019-12-28 ENCOUNTER — Ambulatory Visit (INDEPENDENT_AMBULATORY_CARE_PROVIDER_SITE_OTHER): Payer: Managed Care, Other (non HMO)

## 2019-12-28 DIAGNOSIS — M25561 Pain in right knee: Secondary | ICD-10-CM

## 2019-12-28 DIAGNOSIS — G8929 Other chronic pain: Secondary | ICD-10-CM | POA: Diagnosis not present

## 2019-12-28 MED ORDER — IBUPROFEN 800 MG PO TABS: 800.0000 mg | ORAL_TABLET | Freq: Three times a day (TID) | ORAL | 2 refills | Status: DC | PRN

## 2019-12-28 NOTE — Progress Notes (Signed)
Office Visit Note   Patient: Dominique Hancock           Date of Birth: 12-12-1980           MRN: [id number] Visit Date: 12/28/2019              Requested by: Billie Ruddy, MD [place],  [place] [address] PCP: Billie Ruddy, MD   Assessment & Plan: Visit Diagnoses:  1. Chronic pain of right knee     Plan: Impression is right knee osteoarthritis and effusion.  No evidence of structural abnormalities.  Based on our discussion of treatment options of continue oral NSAIDs versus aspiration injection she would like to try the NSAIDs on a more regular basis for the next couple weeks.  She will let us know if this does not improve her symptoms.  Otherwise she will follow up as needed.  Follow-Up Instructions: Return if symptoms worsen or fail to improve.   Orders:  Orders Placed This Encounter  Procedures  . XR KNEE 3 VIEW RIGHT   Meds ordered this encounter  Medications  . ibuprofen (ADVIL) 800 MG tablet    Sig: Take 1 tablet (800 mg total) by mouth every 8 (eight) hours as needed.    Dispense:  40 tablet    Refill:  2      Procedures: No procedures performed   Clinical Data: No additional findings.   Subjective: Chief Complaint  Patient presents with  . Right Knee - Edema    Dominique Hancock is a 39 year old female comes in for evaluation of chronic knee pain and swelling for approximately the last month.  Denies any recent injuries.  She had a remote twisting injury over 10 years ago.  This resolved without any issues.  She has been taking Advil sporadically which does help reduce the swelling.  She denies any mechanical symptoms or constitutional symptoms.  She does endorse swelling and effusion that is worse at the end of the day.  She really does not have much pain.  Its more pressure.   Review of Systems  Constitutional: Negative.   HENT: Negative.   Eyes: Negative.   Respiratory: Negative.   Cardiovascular: Negative.   Endocrine:  Negative.   Musculoskeletal: Negative.   Neurological: Negative.   Hematological: Negative.   Psychiatric/Behavioral: Negative.   All other systems reviewed and are negative.    Objective: Vital Signs: There were no vitals taken for this visit.  Physical Exam Vitals and nursing note reviewed.  Constitutional:      Appearance: She is well-developed.  HENT:     Head: Normocephalic and atraumatic.  Pulmonary:     Effort: Pulmonary effort is normal.  Abdominal:     Palpations: Abdomen is soft.  Musculoskeletal:     Cervical back: Neck supple.  Skin:    General: Skin is warm.     Capillary Refill: Capillary refill takes less than 2 seconds.  Neurological:     Mental Status: She is alert and oriented to person, place, and time.  Psychiatric:        Behavior: Behavior normal.        Thought Content: Thought content normal.        Judgment: Judgment normal.     Ortho Exam Right knee shows a small joint effusion.  Collaterals and cruciates are stable.  Normal range of motion.  No joint line tenderness. Specialty Comments:  No specialty comments available.  Imaging: XR KNEE 3 VIEW  RIGHT  Result Date: 12/28/2019 No acute or structural abnormalities    PMFS History: Patient Active Problem List   Diagnosis Date Noted  . Chronic pain of right knee 12/28/2019  . Fibroids 03/09/2019  . 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 Anemia   . Menorrhagia   . Submucous uterine fibroid   . Wears glasses     History reviewed. No pertinent family history.  Past Surgical History:  Procedure Laterality Date  . DILATATION & CURETTAGE/HYSTEROSCOPY WITH MYOSURE N/A 04/17/2019   Procedure: DILATATION & CURETTAGE/HYSTEROSCOPY WITH MYOSURE XL;  Surgeon: Servando Salina, MD;  Location: [facility];  Service: Gynecology;  Laterality: N/A;  Request 1 hr.  Marland Kitchen DILATION AND CURETTAGE OF UTERUS  05-25-2007  @[facility]    Social  History   Occupational History  . Not on file  Tobacco Use  . Smoking status: Never Smoker  . Smokeless tobacco: Never Used  Substance and Sexual Activity  . Alcohol use: Yes    Comment: occasional  . Drug use: Never  . Sexual activity: Not on file

## 2020-01-02 ENCOUNTER — Ambulatory Visit
Admission: RE | Admit: 2020-01-02 | Discharge: 2020-01-02 | Disposition: A | Discharge status: Home / Self Care | Payer: Managed Care, Other (non HMO) | Source: Ambulatory Visit | Attending: Obstetrics and Gynecology | Admitting: Obstetrics and Gynecology

## 2020-01-02 ENCOUNTER — Other Ambulatory Visit: Payer: Self-pay

## 2020-01-02 ENCOUNTER — Encounter: Payer: Self-pay | Admitting: *Deleted

## 2020-01-02 DIAGNOSIS — D250 Submucous leiomyoma of uterus: Secondary | ICD-10-CM

## 2020-01-02 HISTORY — PX: IR RADIOLOGIST EVAL & MGMT: IMG5224

## 2020-01-02 NOTE — Consult Note (Signed)
Chief Complaint: Patient was consulted remotely today (TeleHealth) for uterine artery embolization consultation at the request of Cousins,Sheronette.    Referring Physician(s): Cousins,Sheronette  History of Present Illness: Dominique Hancock is a 39 y.o. female with history of abnormal uterine bleeding and menorrhagia.  OB history is G6, P2.  Patient underwent hysteroscopy and resection of a submucosal fibroid on 04/17/2019.  Patient was having heavy and prolonged menstrual bleeding prior to the surgery and has had minimal relief since the surgery.  She gets minimal relief with birth control pills.  Currently, the menstrual bleeding is intermittent.  The bleeding can last 10 to 20 days with days of very heavy flow.  Patient has a 38 year old and a 38 year old.  She is not sure if she wants to have additional children.  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  Her primary problem is the heavy and irregular menstrual bleeding.  She has some urinary frequency.  No issues with constipation.  Pap smear from 10/31/2019 demonstrated atypical squamous cells of undetermined significance.  Endometrial biopsy on 12/22/2019 was insufficient for diagnosis.  Patient is scheduled to undergo another endometrial biopsy.  Patient is taking ibuprofen for right knee pain.  Past Medical History:  Diagnosis Date  . Anemia   . Menorrhagia   . Submucous uterine fibroid   . Wears glasses     Past Surgical History:  Procedure Laterality Date  . DILATATION & CURETTAGE/HYSTEROSCOPY WITH MYOSURE N/A 04/17/2019   Procedure: DILATATION & CURETTAGE/HYSTEROSCOPY WITH MYOSURE XL;  Surgeon: Servando Salina, MD;  Location: [facility];  Service: Gynecology;  Laterality: N/A;  Request 1 hr.  Marland Kitchen DILATION AND CURETTAGE OF UTERUS  05-25-2007  @[facility]     Allergies: Patient has no known allergies.  Medications: Prior to Admission medications   Medication Sig  Start Date End Date Taking? Authorizing Provider  Fe Fum-FePoly-Vit C-Vit B3 (INTEGRA) 62.5-62.5-40-3 MG CAPS Take 1 capsule by mouth daily. 09/06/19   [provider]  fluocinonide ointment (LIDEX) 0.05 % Apply to the scalp 3 times weekly. Never to face/groin 06/05/19   [provider]  ibuprofen (ADVIL) 800 MG tablet Take 1 tablet (800 mg total) by mouth every 8 (eight) hours as needed. 12/18/19   Billie Ruddy, MD  ibuprofen (ADVIL) 800 MG tablet Take 1 tablet (800 mg total) by mouth every 8 (eight) hours as needed. 12/28/19   Leandrew Koyanagi, MD  metroNIDAZOLE (FLAGYL) 500 MG tablet Take 500 mg by mouth 2 (two) times daily. 09/06/19   [provider]  Multiple Vitamin (MULTIVITAMIN) tablet Take 1 tablet by mouth daily.    [provider]  norethindrone (INCASSIA) 0.35 MG tablet TAKE 1 TABLET BY MOUTH EVERY DAY 09/08/19   [provider]  Probiotic Product (PROBIOTIC DAILY) CAPS Take by mouth 2 (two) times a week.    [provider]  SRONYX 0.1-20 MG-MCG tablet Take 1 tablet by mouth daily. 11/20/19   [provider]  tranexamic acid (LYSTEDA) 650 MG TABS tablet TAKE 2 (TWO) TABLET THREE TIMES DAILY DAYS 1 5 OF CYCLE 08/11/19   [provider]  VITAMIN E PO Take by mouth daily.    [provider]     No family history on file.  Social History   Socioeconomic History  . Marital status: Single    Spouse name: Not on file  . Number of children: Not on file  . Years of education:  Not on file  . Highest education level: Not on file  Occupational History  . Not on file  Tobacco Use  . Smoking status: Never Smoker  . Smokeless tobacco: Never Used  Substance and Sexual Activity  . Alcohol use: Yes    Comment: occasional  . Drug use: Never  . Sexual activity: Not on file  Other Topics Concern  . Not on file  Social History Narrative  . Not on file   Social Determinants of Health   Financial Resource Strain:     . Difficulty of Paying Living Expenses:   Food Insecurity:   . Worried About Charity fundraiser in the Last Year:   . Arboriculturist in the Last Year:   Transportation Needs:   . Film/video editor (Medical):   Marland Kitchen Lack of Transportation (Non-Medical):   Physical Activity:   . Days of Exercise per Week:   . Minutes of Exercise per Session:   Stress:   . Feeling of Stress :   Social Connections:   . Frequency of Communication with Friends and Family:   . Frequency of Social Gatherings with Friends and Family:   . Attends Religious Services:   . Active Member of Clubs or Organizations:   . Attends Archivist Meetings:   Marland Kitchen Marital Status:      Review of Systems  Gastrointestinal: Negative for constipation.  Genitourinary: Positive for frequency and menstrual problem.    Physical Exam No direct physical exam was performed   Vital Signs: There were no vitals taken for this visit.  Imaging: XR KNEE 3 VIEW RIGHT  Result Date: 12/28/2019 No acute or structural abnormalities   Labs:  CBC: Recent Labs    04/13/19 1208  WBC 8.8  HGB 9.9*  HCT 32.2*  PLT 303    COAGS: No results for input(s): INR, APTT in the last 8760 hours.  BMP: No results for input(s): NA, K, CL, CO2, GLUCOSE, BUN, CALCIUM, CREATININE, GFRNONAA, GFRAA in the last 8760 hours.  Invalid input(s): CMP  LIVER FUNCTION TESTS: No results for input(s): BILITOT, AST, ALT, ALKPHOS, PROT, ALBUMIN in the last 8760 hours.  TUMOR MARKERS: No results for input(s): AFPTM, CEA, CA199, CHROMGRNA in the last 8760 hours.    Ultrasound report from 11/20/2019: Multiple fibroids noted.  No ovarian abnormalities.  Assessment and Plan:  39 year old with history of abnormal uterine bleeding, menorrhagia and hysteroscopic resection of a submucosal fibroid.  She continues to have heavy irregular menstrual bleeding.  Recent US demonstrated a multiple uterine fibroids.  Patient is not interested in  hysterectomy.  We discussed the uterine artery embolization in depth.  We discussed different vascular accesses including left radial access or femoral access; moderate sedation use during the procedure; post embolization syndrome symptoms which include pain, cramping, nausea and vomiting.  Discussed the success rate particularly with menorrhagia.  We discussed the risks including vascular injury, bleeding and infection..  I believe the patient has a very good understanding of the procedure.  Based on our discussion, she is interested in pursuing the uterine artery embolization procedure.   Explained that future pregnancy is not recommended following the uterine artery embolization procedure.  Patient is also scheduled to have another endometrial biopsy when her bleeding stops.  We will proceed with MRI of the pelvis, with and without contrast.  If there are no contraindications based on the MRI, patient appears to be an excellent candidate for uterine artery embolization procedure.  We will  contact the patient after the MRI results.  Thank you for this interesting consult.  I greatly enjoyed meeting Viena Yarish and look forward to participating in their care.  A copy of this report was sent to the requesting provider on this date.  Electronically Signed: Burman Riis 01/02/2020, 12:29 PM   I spent a total of  30 Minutes   in remote  clinical consultation, greater than 50% of which was counseling/coordinating care for abnormal uterine bleeding, menorrhagia and fibroids.    Visit type: Audio only (telephone). Audio (no video) only due to Patient preference.. Alternative for in-person consultation at [facility], [address], [place], [place]. This visit type was conducted due to national recommendations for restrictions regarding the COVID-19 Pandemic (e.g. social distancing).  This format is felt to be most appropriate for this patient at this time.  All issues noted in this  document were discussed and addressed.

## 2020-01-03 ENCOUNTER — Other Ambulatory Visit: Payer: Self-pay | Admitting: Diagnostic Radiology

## 2020-01-03 DIAGNOSIS — D250 Submucous leiomyoma of uterus: Secondary | ICD-10-CM

## 2020-01-17 ENCOUNTER — Ambulatory Visit (HOSPITAL_COMMUNITY)
Admission: RE | Admit: 2020-01-17 | Discharge: 2020-01-17 | Disposition: A | Discharge status: Home / Self Care | Payer: Managed Care, Other (non HMO) | Source: Ambulatory Visit | Attending: Diagnostic Radiology | Admitting: Diagnostic Radiology

## 2020-01-17 ENCOUNTER — Other Ambulatory Visit: Payer: Self-pay

## 2020-01-17 DIAGNOSIS — D250 Submucous leiomyoma of uterus: Secondary | ICD-10-CM | POA: Insufficient documentation

## 2020-01-17 MED ORDER — GADOBUTROL 1 MMOL/ML IV SOLN
7.0000 mL | Freq: Once | INTRAVENOUS | Status: AC | PRN
  Administered 2020-01-17: 7 mL via INTRAVENOUS

## 2020-01-22 ENCOUNTER — Encounter: Payer: Managed Care, Other (non HMO) | Admitting: Family Medicine

## 2020-01-23 ENCOUNTER — Telehealth: Payer: Self-pay | Admitting: Diagnostic Radiology

## 2020-01-23 NOTE — Telephone Encounter (Signed)
Reviewed MRI results over the telephone.  MRI demonstrates multiple enhancing fibroids.  Dominant intracavitary fibroid measures up to 4 cm.  She is a candidate for uterine artery embolization but I have some concerns about the large intracavitary fibroid.  I am mostly worried that this intracavitary fibroid could lead to prolonged vaginal discharge after embolization.  I shared my concerns with the patient but still think she will benefit from a [place].

## 2020-02-08 ENCOUNTER — Other Ambulatory Visit: Payer: Self-pay

## 2020-02-09 ENCOUNTER — Encounter: Payer: Self-pay | Admitting: Family Medicine

## 2020-02-09 ENCOUNTER — Ambulatory Visit (INDEPENDENT_AMBULATORY_CARE_PROVIDER_SITE_OTHER): Payer: Managed Care, Other (non HMO) | Admitting: Family Medicine

## 2020-02-09 VITALS — BP 128/86 | HR 74 | Temp 97.9°F | Wt 166.0 lb

## 2020-02-09 DIAGNOSIS — Z131 Encounter for screening for diabetes mellitus: Secondary | ICD-10-CM

## 2020-02-09 DIAGNOSIS — Z0000 Encounter for general adult medical examination without abnormal findings: Secondary | ICD-10-CM | POA: Diagnosis not present

## 2020-02-09 DIAGNOSIS — D219 Benign neoplasm of connective and other soft tissue, unspecified: Secondary | ICD-10-CM | POA: Diagnosis not present

## 2020-02-09 DIAGNOSIS — Z13220 Encounter for screening for lipoid disorders: Secondary | ICD-10-CM | POA: Diagnosis not present

## 2020-02-09 LAB — LIPID PANEL
Cholesterol: 231 mg/dL — ABNORMAL HIGH (ref 0–200)
HDL: 55.1 mg/dL (ref >39.00)
LDL Cholesterol: 155 mg/dL — ABNORMAL HIGH (ref 0–99)
NonHDL: 175.74
Total CHOL/HDL Ratio: 4
Triglycerides: 102 mg/dL (ref 0.0–149.0)
VLDL: 20.4 mg/dL (ref 0.0–40.0)

## 2020-02-09 LAB — CBC WITH DIFFERENTIAL/PLATELET
Basophils Absolute: 0 10*3/uL (ref 0.0–0.1)
Basophils Relative: 0.9 % (ref 0.0–3.0)
Eosinophils Absolute: 0 10*3/uL (ref 0.0–0.7)
Eosinophils Relative: 0.7 % (ref 0.0–5.0)
HCT: 27.5 % — ABNORMAL LOW (ref 36.0–46.0)
Hemoglobin: 8.7 g/dL — ABNORMAL LOW (ref 12.0–15.0)
Lymphocytes Relative: 33 % (ref 12.0–46.0)
Lymphs Abs: 1.8 10*3/uL (ref 0.7–4.0)
MCHC: 31.7 g/dL (ref 30.0–36.0)
MCV: 74.7 fl — ABNORMAL LOW (ref 78.0–100.0)
Monocytes Absolute: 0.4 10*3/uL (ref 0.1–1.0)
Monocytes Relative: 6.5 % (ref 3.0–12.0)
Neutro Abs: 3.2 10*3/uL (ref 1.4–7.7)
Neutrophils Relative %: 58.9 % (ref 43.0–77.0)
Platelets: 405 10*3/uL — ABNORMAL HIGH (ref 150.0–400.0)
RBC: 3.68 Mil/uL — ABNORMAL LOW (ref 3.87–5.11)
RDW: 19.9 % — ABNORMAL HIGH (ref 11.5–15.5)
WBC: 5.5 10*3/uL (ref 4.0–10.5)

## 2020-02-09 LAB — TSH: TSH: 1.83 u[IU]/mL (ref 0.35–4.50)

## 2020-02-09 LAB — BASIC METABOLIC PANEL
BUN: 8 mg/dL (ref 6–23)
CO2: 25 mEq/L (ref 19–32)
Calcium: 9.2 mg/dL (ref 8.4–10.5)
Chloride: 105 mEq/L (ref 96–112)
Creatinine, Ser: 0.75 mg/dL (ref 0.40–1.20)
GFR: 104.61 mL/min (ref >60.00)
Glucose, Bld: 90 mg/dL (ref 70–99)
Potassium: 4.2 mEq/L (ref 3.5–5.1)
Sodium: 139 mEq/L (ref 135–145)

## 2020-02-09 LAB — HEMOGLOBIN A1C: Hgb A1c MFr Bld: 5.8 % (ref 4.6–6.5)

## 2020-02-09 LAB — T4, FREE: Free T4: 0.77 ng/dL (ref 0.60–1.60)

## 2020-02-09 NOTE — Progress Notes (Signed)
Subjective:     Dominique Hancock is a 39 y.o. female and is here for a comprehensive physical exam. The patient reports no problems.  Saw Ortho, dr. Erlinda Hong for right knee edema and pain.  Notes improvement.  Currently without edema.  Taking ibuprofen 800 mg 3 times daily with food.  Had x-rays, OA noted.  Trying to avoid aspiration.  Had an I&D with OB/Gyn, Dr. Garwin Brothers.  Has fibroids.  Considering embolization.  Also followed by Dr. Leonie Green, dermatology for central centrifugal scarring alopecia.  Patient states she has been doing well overall.  Currently considering finding a new job with a larger company.  Works in [place].  Social History   Socioeconomic History  . Marital status: Single    Spouse name: Not on file  . Number of children: Not on file  . Years of education: Not on file  . Highest education level: Not on file  Occupational History  . Not on file  Tobacco Use  . Smoking status: Never Smoker  . Smokeless tobacco: Never Used  Vaping Use  . Vaping Use: Never assessed  Substance and Sexual Activity  . Alcohol use: Yes    Comment: occasional  . Drug use: Never  . Sexual activity: Not on file  Other Topics Concern  . Not on file  Social History Narrative  . Not on file   Social Determinants of Health   Financial Resource Strain:   . Difficulty of Paying Living Expenses:   Food Insecurity:   . Worried About Charity fundraiser in the Last Year:   . Arboriculturist in the Last Year:   Transportation Needs:   . Film/video editor (Medical):   Marland Kitchen Lack of Transportation (Non-Medical):   Physical Activity:   . Days of Exercise per Week:   . Minutes of Exercise per Session:   Stress:   . Feeling of Stress :   Social Connections:   . Frequency of Communication with Friends and Family:   . Frequency of Social Gatherings with Friends and Family:   . Attends Religious Services:   . Active Member of Clubs or Organizations:   . Attends Archivist Meetings:    Marland Kitchen Marital Status:   Intimate Partner Violence:   . Fear of Current or Ex-Partner:   . Emotionally Abused:   Marland Kitchen Physically Abused:   . Sexually Abused:    Health Maintenance  Topic Date Due  . Hepatitis C Screening  Never done  . COVID-19 Vaccine (1) Never done  . HIV Screening  Never done  . TETANUS/TDAP  Never done  . INFLUENZA VACCINE  03/17/2020  . PAP SMEAR-Modifier  08/08/2021    The following portions of the patient's history were reviewed and updated as appropriate: allergies, current medications, past family history, past medical history, past social history, past surgical history and problem list.  Review of Systems Pertinent items noted in HPI and remainder of comprehensive ROS otherwise negative.   Objective:    BP 128/86 (BP Location: Left Arm, Patient Position: Sitting, Cuff Size: Normal)   Pulse 74   Temp 97.9 F (36.6 C) (Temporal)   Wt 166 lb (75.3 kg)   LMP 02/07/2020 (Exact Date)   SpO2 99%   BMI 27.62 kg/m  General appearance: alert, cooperative and no distress Head: Normocephalic, without obvious abnormality, atraumatic Eyes: conjunctivae/corneas clear. PERRL, EOM's intact. Fundi benign. Ears: normal TM's and external ear canals both ears Nose: Nares normal. Septum midline. Mucosa  normal. No drainage or sinus tenderness. Throat: lips, mucosa, and tongue normal; teeth and gums normal Neck: no adenopathy, no carotid bruit, no JVD, supple, symmetrical, trachea midline and thyroid not enlarged, symmetric, no tenderness/mass/nodules Lungs: clear to auscultation bilaterally Heart: regular rate and rhythm, S1, S2 normal, no murmur, click, rub or gallop Abdomen: soft, non-tender; bowel sounds normal; no masses,  no organomegaly Extremities: extremities normal, atraumatic, no cyanosis or edema Pulses: 2+ and symmetric Skin: Skin color, texture, turgor normal. No rashes or lesions Lymph nodes: Cervical, supraclavicular, and axillary nodes normal. Neurologic:  Alert and oriented X 3, normal strength and tone. Normal symmetric reflexes. Normal coordination and gait    Assessment:    Healthy female exam.      Plan:     Anticipatory guidance given including wearing seatbelts, smoke detectors in the home, increasing physical activity, increasing p.o. intake of water and vegetables. -will obtain labs -Pap up-to-date. -Given handout -Next CPE in 1 year See After Visit Summary for Counseling Recommendations    Screening for cholesterol level  - Plan: Lipid panel  Fibroids  -Considering embolization -Continue follow-up with OB/GYN - Plan: CBC with Differential/Platelet  Screening for diabetes mellitus - Plan: Hemoglobin A1c  Follow-up as needed  Grier Mitts, MD

## 2020-02-09 NOTE — Patient Instructions (Signed)
Preventive Care 21-39 Years Old, Female Preventive care refers to visits with your health care provider and lifestyle choices that can promote health and wellness. This includes:  A yearly physical exam. This may also be called an annual well check.  Regular dental visits and eye exams.  Immunizations.  Screening for certain conditions.  Healthy lifestyle choices, such as eating a healthy diet, getting regular exercise, not using drugs or products that contain nicotine and tobacco, and limiting alcohol use. What can I expect for my preventive care visit? Physical exam Your health care provider will check your:  Height and weight. This may be used to calculate body mass index (BMI), which tells if you are at a healthy weight.  Heart rate and blood pressure.  Skin for abnormal spots. Counseling Your health care provider may ask you questions about your:  Alcohol, tobacco, and drug use.  Emotional well-being.  Home and relationship well-being.  Sexual activity.  Eating habits.  Work and work environment.  Method of birth control.  Menstrual cycle.  Pregnancy history. What immunizations do I need?  Influenza (flu) vaccine  This is recommended every year. Tetanus, diphtheria, and pertussis (Tdap) vaccine  You may need a Td booster every 10 years. Varicella (chickenpox) vaccine  You may need this if you have not been vaccinated. Human papillomavirus (HPV) vaccine  If recommended by your health care provider, you may need three doses over 6 months. Measles, mumps, and rubella (MMR) vaccine  You may need at least one dose of MMR. You may also need a second dose. Meningococcal conjugate (MenACWY) vaccine  One dose is recommended if you are age 19-21 years and a first-year college student living in a residence hall, or if you have one of several medical conditions. You may also need additional booster doses. Pneumococcal conjugate (PCV13) vaccine  You may need  this if you have certain conditions and were not previously vaccinated. Pneumococcal polysaccharide (PPSV23) vaccine  You may need one or two doses if you smoke cigarettes or if you have certain conditions. Hepatitis A vaccine  You may need this if you have certain conditions or if you travel or work in places where you may be exposed to hepatitis A. Hepatitis B vaccine  You may need this if you have certain conditions or if you travel or work in places where you may be exposed to hepatitis B. Haemophilus influenzae type b (Hib) vaccine  You may need this if you have certain conditions. You may receive vaccines as individual doses or as more than one vaccine together in one shot (combination vaccines). Talk with your health care provider about the risks and benefits of combination vaccines. What tests do I need?  Blood tests  Lipid and cholesterol levels. These may be checked every 5 years starting at age 20.  Hepatitis C test.  Hepatitis B test. Screening  Diabetes screening. This is done by checking your blood sugar (glucose) after you have not eaten for a while (fasting).  Sexually transmitted disease (STD) testing.  BRCA-related cancer screening. This may be done if you have a family history of breast, ovarian, tubal, or peritoneal cancers.  Pelvic exam and Pap test. This may be done every 3 years starting at age 21. Starting at age 30, this may be done every 5 years if you have a Pap test in combination with an HPV test. Talk with your health care provider about your test results, treatment options, and if necessary, the need for more tests.   Follow these instructions at home: Eating and drinking   Eat a diet that includes fresh fruits and vegetables, whole grains, lean protein, and low-fat dairy.  Take vitamin and mineral supplements as recommended by your health care provider.  Do not drink alcohol if: ? Your health care provider tells you not to drink. ? You are  pregnant, may be pregnant, or are planning to become pregnant.  If you drink alcohol: ? Limit how much you have to 0-1 drink a day. ? Be aware of how much alcohol is in your drink. In the [place] one drink equals one 12 oz bottle of beer (355 mL), one 5 oz glass of wine (148 mL), or one 1 oz glass of hard liquor (44 mL). Lifestyle  Take daily care of your teeth and gums.  Stay active. Exercise for at least 30 minutes on 5 or more days each week.  Do not use any products that contain nicotine or tobacco, such as cigarettes, e-cigarettes, and chewing tobacco. If you need help quitting, ask your health care provider.  If you are sexually active, practice safe sex. Use a condom or other form of birth control (contraception) in order to prevent pregnancy and STIs (sexually transmitted infections). If you plan to become pregnant, see your health care provider for a preconception visit. What's next?  Visit your health care provider once a year for a well check visit.  Ask your health care provider how often you should have your eyes and teeth checked.  Stay up to date on all vaccines. This information is not intended to replace advice given to you by your health care provider. Make sure you discuss any questions you have with your health care provider. Document Revised: 04/14/2018 Document Reviewed: 04/14/2018 Elsevier Patient Education  2020 [employer].

## 2020-02-27 ENCOUNTER — Other Ambulatory Visit (HOSPITAL_COMMUNITY): Payer: Self-pay | Admitting: Diagnostic Radiology

## 2020-02-27 DIAGNOSIS — D259 Leiomyoma of uterus, unspecified: Secondary | ICD-10-CM

## 2020-03-08 ENCOUNTER — Emergency Department (HOSPITAL_COMMUNITY): Payer: Managed Care, Other (non HMO)

## 2020-03-08 ENCOUNTER — Emergency Department (HOSPITAL_COMMUNITY)
Admission: EM | Admit: 2020-03-08 | Discharge: 2020-03-09 | Disposition: A | Discharge status: Home / Self Care | Payer: Managed Care, Other (non HMO) | Attending: Emergency Medicine | Admitting: Emergency Medicine

## 2020-03-08 ENCOUNTER — Other Ambulatory Visit: Payer: Self-pay

## 2020-03-08 ENCOUNTER — Encounter (HOSPITAL_COMMUNITY): Payer: Self-pay | Admitting: Obstetrics and Gynecology

## 2020-03-08 DIAGNOSIS — R102 Pelvic and perineal pain: Secondary | ICD-10-CM | POA: Diagnosis present

## 2020-03-08 DIAGNOSIS — D649 Anemia, unspecified: Secondary | ICD-10-CM | POA: Diagnosis not present

## 2020-03-08 DIAGNOSIS — L669 Cicatricial alopecia, unspecified: Secondary | ICD-10-CM | POA: Diagnosis not present

## 2020-03-08 DIAGNOSIS — D250 Submucous leiomyoma of uterus: Secondary | ICD-10-CM | POA: Diagnosis not present

## 2020-03-08 DIAGNOSIS — D219 Benign neoplasm of connective and other soft tissue, unspecified: Secondary | ICD-10-CM

## 2020-03-08 LAB — URINALYSIS, ROUTINE W REFLEX MICROSCOPIC
Bilirubin Urine: NEGATIVE
Glucose, UA: NEGATIVE mg/dL
Ketones, ur: NEGATIVE mg/dL
Leukocytes,Ua: NEGATIVE
Nitrite: NEGATIVE
Protein, ur: NEGATIVE mg/dL
RBC / HPF: >50 RBC/hpf — ABNORMAL HIGH (ref 0–5)
Specific Gravity, Urine: 1.013 (ref 1.005–1.030)
pH: 5 (ref 5.0–8.0)

## 2020-03-08 LAB — CBC
HCT: 27.4 % — ABNORMAL LOW (ref 36.0–46.0)
Hemoglobin: 8.1 g/dL — ABNORMAL LOW (ref 12.0–15.0)
MCH: 22.6 pg — ABNORMAL LOW (ref 26.0–34.0)
MCHC: 29.6 g/dL — ABNORMAL LOW (ref 30.0–36.0)
MCV: 76.5 fL — ABNORMAL LOW (ref 80.0–100.0)
Platelets: 386 10*3/uL (ref 150–400)
RBC: 3.58 MIL/uL — ABNORMAL LOW (ref 3.87–5.11)
RDW: 17 % — ABNORMAL HIGH (ref 11.5–15.5)
WBC: 12 10*3/uL — ABNORMAL HIGH (ref 4.0–10.5)
nRBC: 0 % (ref 0.0–0.2)

## 2020-03-08 LAB — COMPREHENSIVE METABOLIC PANEL
ALT: 16 U/L (ref 0–44)
AST: 22 U/L (ref 15–41)
Albumin: 4.1 g/dL (ref 3.5–5.0)
Alkaline Phosphatase: 55 U/L (ref 38–126)
Anion gap: 9 (ref 5–15)
BUN: 7 mg/dL (ref 6–20)
CO2: 25 mmol/L (ref 22–32)
Calcium: 9.4 mg/dL (ref 8.9–10.3)
Chloride: 104 mmol/L (ref 98–111)
Creatinine, Ser: 0.71 mg/dL (ref 0.44–1.00)
GFR calc Af Amer: >60 mL/min (ref >60)
GFR calc non Af Amer: >60 mL/min (ref >60)
Glucose, Bld: 102 mg/dL — ABNORMAL HIGH (ref 70–99)
Potassium: 3.7 mmol/L (ref 3.5–5.1)
Sodium: 138 mmol/L (ref 135–145)
Total Bilirubin: 0.5 mg/dL (ref 0.3–1.2)
Total Protein: 8.6 g/dL — ABNORMAL HIGH (ref 6.5–8.1)

## 2020-03-08 LAB — I-STAT BETA HCG BLOOD, ED (MC, WL, AP ONLY): I-stat hCG, quantitative: <5 m[IU]/mL (ref <5)

## 2020-03-08 LAB — LIPASE, BLOOD: Lipase: 28 U/L (ref 11–51)

## 2020-03-08 MED ORDER — OXYCODONE-ACETAMINOPHEN 5-325 MG PO TABS
1.0000 | ORAL_TABLET | Freq: Once | ORAL | Status: AC
  Administered 2020-03-08: 1 via ORAL
  Filled 2020-03-08: qty 1

## 2020-03-08 NOTE — ED Notes (Signed)
Pt ambulated to [facility]

## 2020-03-08 NOTE — ED Notes (Signed)
Pelvic set up and ready for bedside

## 2020-03-08 NOTE — ED Triage Notes (Signed)
Patient reports to the ER for abdominal pain. Patient reports she has uterine fibroids and has been passing heavy clots. Patient reports she is anemic as well.

## 2020-03-08 NOTE — ED Notes (Signed)
Provided pt w/labeled specimen cup for urine collection. ENMiles 

## 2020-03-08 NOTE — ED Notes (Signed)
Urine culture sent with UA 

## 2020-03-09 MED ORDER — OXYCODONE-ACETAMINOPHEN 5-325 MG PO TABS: 1.0000 | ORAL_TABLET | Freq: Four times a day (QID) | ORAL | 0 refills | Status: DC | PRN

## 2020-03-09 MED ORDER — IBUPROFEN 800 MG PO TABS
800.0000 mg | ORAL_TABLET | Freq: Three times a day (TID) | ORAL | 0 refills | Status: DC | PRN
Start: 2020-03-09 — End: 2020-04-26

## 2020-03-09 NOTE — ED Provider Notes (Signed)
Blood pressure 120/80, pulse 66, temperature 98.3 F (36.8 C), temperature source Oral, resp. rate 17, height 5\' 5"  (1.651 m), weight 69.4 kg, last menstrual period 03/06/2020, SpO2 100 %.  Assuming care from Dr. Wilson Singer.  In short, Dominique Hancock is a 39 y.o. female with a chief complaint of Abdominal Pain .  Refer to the original H&P for additional details.  The current plan of care is to f/u on TVUS.  [place] reviewed. No acute findings. Patient with outpatient plan for fibroid mgmt. Pain meds called in by Dr. Wilson Singer. Called in Rx for Mortin as well.     Margette Fast, MD 03/09/20 [phone]

## 2020-03-09 NOTE — Discharge Instructions (Addendum)
Continue to take ibuprofen. Use percocet for breakthrough pain. Follow-up with your GYN. You hemoglobin has been trending down. If you feel like you are becoming more short of breath, dizzy, lightheaded, etc then I would get a repeat CBC. This can be arranged by either your PCP or GYN. If you feel like symptoms are bad enough then just come back to the ER.

## 2020-03-09 NOTE — ED Provider Notes (Signed)
[facility] DEPT Provider Note   CSN: [id number] Arrival date & time: 03/08/20  1449     History Chief Complaint  Patient presents with  . Abdominal Pain    Dominique Hancock is a 39 y.o. female.  HPI   39 year old female with abdominal pain/pelvic and bleeding.  Known uterine fibroids.  Pelvic pain is not unusual for her although recent symptoms more intense than typical also having more bleeding.  She reports that she is followed by GYN and is actually scheduled for an embolization next month.  She has been taking ibuprofen which does help somewhat.  She is also on oral contraceptive pills.  Past Medical History:  Diagnosis Date  . Anemia   . Menorrhagia   . Submucous uterine fibroid   . Wears glasses    Patient Active Problem List   Diagnosis Date Noted  . Chronic pain of right knee 12/28/2019  . Fibroids 03/09/2019  . 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 DILATATION & CURETTAGE/HYSTEROSCOPY WITH MYOSURE N/A 04/17/2019   Procedure: DILATATION & CURETTAGE/HYSTEROSCOPY WITH MYOSURE XL;  Surgeon: Servando Salina, MD;  Location: [facility];  Service: Gynecology;  Laterality: N/A;  Request 1 hr.  Marland Kitchen DILATION AND CURETTAGE OF UTERUS  05-25-2007  @[facility]   . IR RADIOLOGIST EVAL & MGMT  01/02/2020    OB History   No obstetric history on file.    No family history on file.  Social History   Tobacco Use  . Smoking status: Never Smoker  . Smokeless tobacco: Never Used  Vaping Use  . Vaping Use: Never assessed  Substance Use Topics  . Alcohol use: Yes    Comment: occasional  . Drug use: Never    Home Medications Prior to Admission medications   Medication Sig Start Date End Date Taking? Authorizing Provider  ibuprofen (ADVIL) 800 MG tablet Take 1 tablet (800 mg total) by mouth every 8 (eight) hours as needed. 12/28/19  Yes Leandrew Koyanagi, MD  Multiple  Vitamin (MULTIVITAMIN) tablet Take 1 tablet by mouth daily.   Yes [provider]  [place] 0.1-20 MG-MCG tablet Take 1 tablet by mouth daily. 11/20/19  Yes [provider]  VITAMIN E PO Take by mouth daily.   Yes [provider]  Fe Fum-FePoly-Vit C-Vit B3 (INTEGRA) 62.5-62.5-40-3 MG CAPS Take 1 capsule by mouth daily. 09/06/19   [provider]  fluocinonide ointment (LIDEX) 0.05 % Apply to the scalp 3 times weekly. Never to face/groin 06/05/19   [provider]  norethindrone (INCASSIA) 0.35 MG tablet TAKE 1 TABLET BY MOUTH EVERY DAY 09/08/19   [provider]  oxyCODONE-acetaminophen (PERCOCET/ROXICET) 5-325 MG tablet Take 1 tablet by mouth every 6 (six) hours as needed for severe pain. 03/09/20   Virgel Manifold, MD  Probiotic Product (PROBIOTIC DAILY) CAPS Take by mouth 2 (two) times a week.    [provider]  tranexamic acid (LYSTEDA) 650 MG TABS tablet TAKE 2 (TWO) TABLET THREE TIMES DAILY DAYS 1 5 OF CYCLE 08/11/19   [provider]    Allergies    Patient has no known allergies.  Review of Systems   Review of Systems All systems reviewed and negative, other than as noted in HPI.  Physical Exam Updated Vital Signs BP (!) 131/83   Pulse 76   Temp 98.3 F (36.8 C) (Oral)   Resp 18   Ht 5\' 5"  (1.651 m)  Wt 69.4 kg   LMP 03/06/2020 (Exact Date)   SpO2 100%   BMI 25.44 kg/m   Physical Exam Vitals and nursing note reviewed.  Constitutional:      General: She is not in acute distress.    Appearance: She is well-developed.  HENT:     Head: Normocephalic and atraumatic.  Eyes:     General:        Right eye: No discharge.        Left eye: No discharge.     Conjunctiva/sclera: Conjunctivae normal.  Cardiovascular:     Rate and Rhythm: Normal rate and regular rhythm.     Heart sounds: Normal heart sounds. No murmur heard.  No friction rub. No gallop.   Pulmonary:     Effort: Pulmonary effort is normal. No  respiratory distress.     Breath sounds: Normal breath sounds.  Abdominal:     General: There is no distension.     Palpations: Abdomen is soft.     Tenderness: There is abdominal tenderness.  Musculoskeletal:        General: No tenderness.     Cervical back: Neck supple.  Skin:    General: Skin is warm and dry.  Neurological:     Mental Status: She is alert.  Psychiatric:        Behavior: Behavior normal.        Thought Content: Thought content normal.     ED Results / Procedures / Treatments   Labs (all labs ordered are listed, but only abnormal results are displayed) Labs Reviewed  COMPREHENSIVE METABOLIC PANEL - Abnormal; Notable for the following components:      Result Value   Glucose, Bld 102 (*)    Total Protein 8.6 (*)    All other components within normal limits  CBC - Abnormal; Notable for the following components:   WBC 12.0 (*)    RBC 3.58 (*)    Hemoglobin 8.1 (*)    HCT 27.4 (*)    MCV 76.5 (*)    MCH 22.6 (*)    MCHC 29.6 (*)    RDW 17.0 (*)    All other components within normal limits  URINALYSIS, ROUTINE W REFLEX MICROSCOPIC - Abnormal; Notable for the following components:   Hgb urine dipstick LARGE (*)    RBC / HPF >50 (*)    Bacteria, UA RARE (*)    All other components within normal limits  LIPASE, BLOOD  I-STAT BETA HCG BLOOD, ED ([facility], [facility], [facility] ONLY)    EKG None  Radiology No results found.   US PELVIC COMPLETE W TRANSVAGINAL AND TORSION R/O  Result Date: 03/09/2020 CLINICAL DATA:  Pelvic pain, heavy vaginal bleeding, uterine fibroids EXAM: TRANSABDOMINAL AND TRANSVAGINAL ULTRASOUND OF PELVIS DOPPLER ULTRASOUND OF OVARIES TECHNIQUE: Both transabdominal and transvaginal ultrasound examinations of the pelvis were performed. Transabdominal technique was performed for global imaging of the pelvis including uterus, ovaries, adnexal regions, and pelvic cul-de-sac. It was necessary to proceed with endovaginal exam following the transabdominal exam  to visualize the endometrial cavity. Color and duplex Doppler ultrasound was utilized to evaluate blood flow to the ovaries. COMPARISON:  MRI 01/17/2020 FINDINGS: Uterus Measurements: 12.8 x 6.2 x 7.5 cm = volume: 316 mL. The uterus is enlarged. The uterus is anteverted. Multiple uterine masses are again identified within the fundus intramurally and subserosally as well as exophytically, best noted on transabdominal imaging, compatible with multiple uterine fibroids. The largest of these, however, appears to be to  be within the mid body measuring at least 5.5 x 3.0 x 4.6 cm. This is better seen on the prior MRI examination and, while apparently subserosal on this exam, is clearly intracavitary on MRI exam. The endometrium is distorted as result within the mid body, but appears uniform more distally within the fundus. The cervix is closed and is unremarkable. Endometrium Thickness: As noted above, the endometrium is mildly distorted within the mid body, but appears normal in thickness within the fundus measuring approximately 14 mm. Right ovary Measurements: 3.3 x 1.4 x 1.2 cm = volume: 3 mL. Normal appearance/no adnexal mass. Multiple follicles are identified. Left ovary Not visualized Pulsed Doppler evaluation of visualized right ovary demonstrates normal low-resistance arterial and venous waveforms. Other findings No abnormal free fluid. IMPRESSION: Multiple uterine fibroids again demonstrated with a dominant fibroid noted within the endometrial cavity, better seen on prior MRI examination. The endometrium, while distorted secondary to the intracavitary fibroid, is not thickened. Nonvisualization of the left ovary, previously noted to be normal on MRI examination. Normal right ovary. Electronically Signed   By: Fidela Salisbury MD   On: 03/09/2020 00:40    Procedures Procedures (including critical care time)  Medications Ordered in ED Medications  oxyCODONE-acetaminophen (PERCOCET/ROXICET) 5-325 MG per  tablet 1 tablet (1 tablet Oral Given 03/08/20 2302)    ED Course  I have reviewed the triage vital signs and the nursing notes.  Pertinent labs & imaging results that were available during my care of the patient were reviewed by me and considered in my medical decision making (see chart for details).    MDM Rules/Calculators/A&P                          39yF with pelvic pain and vaginal bleeding. Likely from uterine fibroids. Hx of the same although current symptoms more severe than typical for her. Hemoglobin trending down but not to point of transfusion. HD stable. No overt symptoms. Return precautions discussed with regards to this. Not pregnant. Already taking ibuprofen and OCP. Has established GYN care and actually scheduled for embolization next month. Care signed out to Dr Laverta Baltimore with [place] pending. If this does not show any emergent pathology then DC to follow-up with her gynecologist. Additional PRN pain medication prescribed.   Final Clinical Impression(s) / ED Diagnoses Final diagnoses:  Pelvic pain  Fibroids    Rx / DC Orders ED Discharge Orders         Ordered    oxyCODONE-acetaminophen (PERCOCET/ROXICET) 5-325 MG tablet  Every 6 hours PRN     Discontinue  Reprint     03/09/20 0003           Virgel Manifold, MD 03/11/20 2034

## 2020-04-23 ENCOUNTER — Other Ambulatory Visit: Payer: Self-pay | Admitting: Radiology

## 2020-04-23 ENCOUNTER — Other Ambulatory Visit (HOSPITAL_COMMUNITY)
Admission: RE | Admit: 2020-04-23 | Discharge: 2020-04-23 | Disposition: A | Discharge status: Home / Self Care | Payer: Managed Care, Other (non HMO) | Source: Ambulatory Visit | Attending: Diagnostic Radiology | Admitting: Diagnostic Radiology

## 2020-04-23 DIAGNOSIS — Z01812 Encounter for preprocedural laboratory examination: Secondary | ICD-10-CM | POA: Diagnosis present

## 2020-04-23 DIAGNOSIS — Z20822 Contact with and (suspected) exposure to covid-19: Secondary | ICD-10-CM | POA: Diagnosis not present

## 2020-04-24 LAB — SARS CORONAVIRUS 2 (TAT 6-24 HRS): SARS Coronavirus 2: NEGATIVE

## 2020-04-25 ENCOUNTER — Ambulatory Visit (HOSPITAL_COMMUNITY)
Admission: RE | Admit: 2020-04-25 | Discharge: 2020-04-25 | Disposition: A | Discharge status: Home / Self Care | Payer: Managed Care, Other (non HMO) | Source: Ambulatory Visit | Attending: Diagnostic Radiology | Admitting: Diagnostic Radiology

## 2020-04-25 ENCOUNTER — Other Ambulatory Visit: Payer: Self-pay

## 2020-04-25 ENCOUNTER — Observation Stay (HOSPITAL_COMMUNITY)
Admission: RE | Admit: 2020-04-25 | Discharge: 2020-04-26 | Disposition: A | Discharge status: Home / Self Care | Payer: Managed Care, Other (non HMO) | Source: Ambulatory Visit | Attending: Diagnostic Radiology | Admitting: Diagnostic Radiology

## 2020-04-25 DIAGNOSIS — D649 Anemia, unspecified: Secondary | ICD-10-CM | POA: Insufficient documentation

## 2020-04-25 DIAGNOSIS — N921 Excessive and frequent menstruation with irregular cycle: Secondary | ICD-10-CM | POA: Insufficient documentation

## 2020-04-25 DIAGNOSIS — Z79899 Other long term (current) drug therapy: Secondary | ICD-10-CM | POA: Insufficient documentation

## 2020-04-25 DIAGNOSIS — D259 Leiomyoma of uterus, unspecified: Secondary | ICD-10-CM | POA: Diagnosis present

## 2020-04-25 DIAGNOSIS — N920 Excessive and frequent menstruation with regular cycle: Secondary | ICD-10-CM | POA: Diagnosis present

## 2020-04-25 HISTORY — PX: IR ANGIOGRAM SELECTIVE EACH ADDITIONAL VESSEL: IMG667

## 2020-04-25 HISTORY — PX: IR US GUIDE VASC ACCESS RIGHT: IMG2390

## 2020-04-25 HISTORY — PX: IR EMBO TUMOR ORGAN ISCHEMIA INFARCT INC GUIDE ROADMAPPING: IMG5449

## 2020-04-25 HISTORY — PX: IR ANGIOGRAM PELVIS SELECTIVE OR SUPRASELECTIVE: IMG661

## 2020-04-25 LAB — HCG, SERUM, QUALITATIVE: Preg, Serum: NEGATIVE

## 2020-04-25 LAB — BASIC METABOLIC PANEL
Anion gap: 8 (ref 5–15)
BUN: 11 mg/dL (ref 6–20)
CO2: 21 mmol/L — ABNORMAL LOW (ref 22–32)
Calcium: 9 mg/dL (ref 8.9–10.3)
Chloride: 106 mmol/L (ref 98–111)
Creatinine, Ser: 0.7 mg/dL (ref 0.44–1.00)
GFR calc Af Amer: >60 mL/min (ref >60)
GFR calc non Af Amer: >60 mL/min (ref >60)
Glucose, Bld: 94 mg/dL (ref 70–99)
Potassium: 3.6 mmol/L (ref 3.5–5.1)
Sodium: 135 mmol/L (ref 135–145)

## 2020-04-25 LAB — CBC WITH DIFFERENTIAL/PLATELET
Abs Immature Granulocytes: 0.01 10*3/uL (ref 0.00–0.07)
Basophils Absolute: 0 10*3/uL (ref 0.0–0.1)
Basophils Relative: 1 %
Eosinophils Absolute: 0 10*3/uL (ref 0.0–0.5)
Eosinophils Relative: 1 %
HCT: 33.1 % — ABNORMAL LOW (ref 36.0–46.0)
Hemoglobin: 10.5 g/dL — ABNORMAL LOW (ref 12.0–15.0)
Immature Granulocytes: 0 %
Lymphocytes Relative: 32 %
Lymphs Abs: 1.8 10*3/uL (ref 0.7–4.0)
MCH: 26.1 pg (ref 26.0–34.0)
MCHC: 31.7 g/dL (ref 30.0–36.0)
MCV: 82.3 fL (ref 80.0–100.0)
Monocytes Absolute: 0.4 10*3/uL (ref 0.1–1.0)
Monocytes Relative: 8 %
Neutro Abs: 3.4 10*3/uL (ref 1.7–7.7)
Neutrophils Relative %: 58 %
Platelets: 473 10*3/uL — ABNORMAL HIGH (ref 150–400)
RBC: 4.02 MIL/uL (ref 3.87–5.11)
RDW: 22.7 % — ABNORMAL HIGH (ref 11.5–15.5)
WBC: 5.7 10*3/uL (ref 4.0–10.5)
nRBC: 0 % (ref 0.0–0.2)

## 2020-04-25 LAB — PROTIME-INR
INR: 0.9 (ref 0.8–1.2)
Prothrombin Time: 12.2 seconds (ref 11.4–15.2)

## 2020-04-25 MED ORDER — PROMETHAZINE HCL 25 MG PO TABS: 25.0000 mg | ORAL_TABLET | Freq: Three times a day (TID) | ORAL | Status: DC | PRN

## 2020-04-25 MED ORDER — IOHEXOL 300 MG/ML  SOLN
100.0000 mL | Freq: Once | INTRAMUSCULAR | Status: AC | PRN
  Administered 2020-04-25: 50 mL via INTRA_ARTERIAL

## 2020-04-25 MED ORDER — DOCUSATE SODIUM 100 MG PO CAPS
100.0000 mg | ORAL_CAPSULE | Freq: Two times a day (BID) | ORAL | Status: DC
  Administered 2020-04-25 – 2020-04-26 (×2): 100 mg via ORAL
  Filled 2020-04-25 (×2): qty 1

## 2020-04-25 MED ORDER — DIPHENHYDRAMINE HCL 50 MG/ML IJ SOLN: 12.5000 mg | Freq: Four times a day (QID) | INTRAMUSCULAR | Status: DC | PRN

## 2020-04-25 MED ORDER — FENTANYL CITRATE (PF) 100 MCG/2ML IJ SOLN
INTRAMUSCULAR | Status: DC | PRN
  Administered 2020-04-25 (×2): 50 ug via INTRAVENOUS

## 2020-04-25 MED ORDER — ONDANSETRON HCL 4 MG/2ML IJ SOLN
INTRAMUSCULAR | Status: AC
  Filled 2020-04-25: qty 2

## 2020-04-25 MED ORDER — PROMETHAZINE HCL 25 MG RE SUPP: 25.0000 mg | Freq: Three times a day (TID) | RECTAL | Status: DC | PRN

## 2020-04-25 MED ORDER — FENTANYL CITRATE (PF) 100 MCG/2ML IJ SOLN
INTRAMUSCULAR | Status: AC
  Filled 2020-04-25: qty 4

## 2020-04-25 MED ORDER — LIDOCAINE HCL 1 % IJ SOLN
INTRAMUSCULAR | Status: AC
  Filled 2020-04-25: qty 20

## 2020-04-25 MED ORDER — CEFAZOLIN SODIUM-DEXTROSE 2-4 GM/100ML-% IV SOLN: 2.0000 g | INTRAVENOUS | Status: AC

## 2020-04-25 MED ORDER — IOHEXOL 300 MG/ML  SOLN
100.0000 mL | Freq: Once | INTRAMUSCULAR | Status: AC | PRN
  Administered 2020-04-25: 30 mL via INTRA_ARTERIAL

## 2020-04-25 MED ORDER — SODIUM CHLORIDE 0.9 % IV SOLN: 250.0000 mL | INTRAVENOUS | Status: DC | PRN

## 2020-04-25 MED ORDER — SODIUM CHLORIDE 0.9% FLUSH
3.0000 mL | Freq: Two times a day (BID) | INTRAVENOUS | Status: DC
  Administered 2020-04-25 – 2020-04-26 (×2): 3 mL via INTRAVENOUS

## 2020-04-25 MED ORDER — SODIUM CHLORIDE 0.9 % IV SOLN: INTRAVENOUS | Status: DC

## 2020-04-25 MED ORDER — KETOROLAC TROMETHAMINE 30 MG/ML IJ SOLN
30.0000 mg | Freq: Four times a day (QID) | INTRAMUSCULAR | Status: DC
  Administered 2020-04-25 – 2020-04-26 (×4): 30 mg via INTRAVENOUS
  Filled 2020-04-25 (×4): qty 1

## 2020-04-25 MED ORDER — SODIUM CHLORIDE 0.9% FLUSH: 9.0000 mL | INTRAVENOUS | Status: DC | PRN

## 2020-04-25 MED ORDER — ONDANSETRON HCL 4 MG/2ML IJ SOLN: 4.0000 mg | Freq: Four times a day (QID) | INTRAMUSCULAR | Status: DC | PRN

## 2020-04-25 MED ORDER — FERROUS SULFATE 325 (65 FE) MG PO TABS
325.0000 mg | ORAL_TABLET | Freq: Every day | ORAL | Status: DC
  Administered 2020-04-26: 325 mg via ORAL
  Filled 2020-04-25: qty 1

## 2020-04-25 MED ORDER — KETOROLAC TROMETHAMINE 30 MG/ML IJ SOLN
30.0000 mg | INTRAMUSCULAR | Status: AC
  Administered 2020-04-25: 30 mg via INTRAVENOUS
  Filled 2020-04-25: qty 1

## 2020-04-25 MED ORDER — NALOXONE HCL 0.4 MG/ML IJ SOLN: 0.4000 mg | INTRAMUSCULAR | Status: DC | PRN

## 2020-04-25 MED ORDER — HYDROMORPHONE 1 MG/ML IV SOLN
INTRAVENOUS | Status: DC
  Administered 2020-04-25: 30 mg via INTRAVENOUS
  Administered 2020-04-25: 0.9 mg via INTRAVENOUS
  Administered 2020-04-25: 2.7 mg via INTRAVENOUS
  Administered 2020-04-26: 0.3 mg via INTRAVENOUS
  Administered 2020-04-26 (×2): 1.2 mg via INTRAVENOUS
  Filled 2020-04-25: qty 30

## 2020-04-25 MED ORDER — KETOROLAC TROMETHAMINE 30 MG/ML IJ SOLN
30.0000 mg | Freq: Four times a day (QID) | INTRAMUSCULAR | Status: DC
  Administered 2020-04-25: 30 mg via INTRAVENOUS
  Filled 2020-04-25: qty 1

## 2020-04-25 MED ORDER — CEFAZOLIN SODIUM-DEXTROSE 2-4 GM/100ML-% IV SOLN
INTRAVENOUS | Status: AC
  Administered 2020-04-25: 2 g via INTRAVENOUS
  Filled 2020-04-25: qty 100

## 2020-04-25 MED ORDER — SODIUM CHLORIDE 0.9% FLUSH: 3.0000 mL | INTRAVENOUS | Status: DC | PRN

## 2020-04-25 MED ORDER — DIPHENHYDRAMINE HCL 12.5 MG/5ML PO ELIX
12.5000 mg | ORAL_SOLUTION | Freq: Four times a day (QID) | ORAL | Status: DC | PRN
  Filled 2020-04-25: qty 5

## 2020-04-25 MED ORDER — LIDOCAINE HCL (PF) 1 % IJ SOLN
INTRAMUSCULAR | Status: DC | PRN
  Administered 2020-04-25: 5 mL

## 2020-04-25 MED ORDER — MIDAZOLAM HCL 2 MG/2ML IJ SOLN
INTRAMUSCULAR | Status: AC
  Filled 2020-04-25: qty 6

## 2020-04-25 MED ORDER — MIDAZOLAM HCL 2 MG/2ML IJ SOLN
INTRAMUSCULAR | Status: DC | PRN
  Administered 2020-04-25 (×2): 1 mg via INTRAVENOUS

## 2020-04-25 NOTE — Progress Notes (Signed)
Bedside report given to Amy, RN 6 east.   Diet adv.as tol.  Bedrest for 3 hours, may raise HOB 20 degrees.   Bedrest began at 1112 today.   Keep Right leg straight for 3 hrs. Remove foley after bedrest.   Orders are in the computer under signed and held.   PCA was setup at 1053 in radiology. Toradol 30 mg IV q6h will be due again at 1430.  First dose given in short stay this morning at 0823.   Patient was taught how to use the PCA, push the green button,  Button is within patients reach in her hand.   O2 at 2L N/C   Foley catheter to gravity, bag hooked onto bed. Patient is alert and oriented. Rates pain to abdomen 6/10. Encouraged patient to use the PCA and push the button for pain.  Patient knoded with understanding.

## 2020-04-25 NOTE — Procedures (Signed)
Interventional Radiology Procedure:   Indications: Uterine fibroids and menorrhagia  Procedure: Uterine artery embolization  Findings: Successful embolization of bilateral uterine arteries.  AngioSeal closure device used.    Complications: None     EBL: less than 10 ml  Plan: Bedrest 3 hour.  Overnight observation for pain management.   Beola Vasallo R. Anselm Pancoast, MD  Pager: [phone]

## 2020-04-25 NOTE — H&P (Signed)
Referring Physician(s): Cousins,S  Supervising Physician: Markus Daft  Patient Status:  [facility] OP TBA  Chief Complaint:  Symptomatic uterine fibroids  Subjective: Patient familiar to IR service from tele consultation with Dr. Halford Decamp on 01/02/2020 to discuss treatment options for symptomatic uterine fibroids.  She was deemed an appropriate candidate for bilateral uterine artery embolization and presents today for the procedure.  Of note patient does have a 4 cm intracavitary fibroid and 3.5 cm pedunculated fibroid.  She currently denies fever, headache, chest pain, dyspnea, cough, abdominal/back pain, nausea, vomiting.  She does have a history of menorrhagia.  Past Medical History:  Diagnosis Date  . Anemia   . Menorrhagia   . Submucous uterine fibroid   . Wears glasses    Past Surgical History:  Procedure Laterality Date  . DILATATION & CURETTAGE/HYSTEROSCOPY WITH MYOSURE N/A 04/17/2019   Procedure: DILATATION & CURETTAGE/HYSTEROSCOPY WITH MYOSURE XL;  Surgeon: Servando Salina, MD;  Location: [facility];  Service: Gynecology;  Laterality: N/A;  Request 1 hr.  Marland Kitchen DILATION AND CURETTAGE OF UTERUS  05-25-2007  @[facility]   . IR RADIOLOGIST EVAL & MGMT  01/02/2020      Allergies: Patient has no known allergies.  Medications: Prior to Admission medications   Medication Sig Start Date End Date Taking? Authorizing Provider  Fe Fum-FePoly-Vit C-Vit B3 (INTEGRA) 62.5-62.5-40-3 MG CAPS Take 1 capsule by mouth daily. 09/06/19  Yes [provider]  fluocinonide ointment (LIDEX) 0.05 % Apply to the scalp 3 times weekly. Never to face/groin 06/05/19  Yes [provider]  Multiple Vitamin (MULTIVITAMIN) tablet Take 1 tablet by mouth daily.   Yes [provider]  norethindrone (INCASSIA) 0.35 MG tablet TAKE 1 TABLET BY MOUTH EVERY DAY 09/08/19  Yes [provider]  Probiotic Product (PROBIOTIC DAILY) CAPS Take by mouth 2 (two) times a week.   Yes  [provider]  [place]ruce 0.1-20 MG-MCG tablet Take 1 tablet by mouth daily. 11/20/19  Yes [provider]  tranexamic acid (LYSTEDA) 650 MG TABS tablet TAKE 2 (TWO) TABLET THREE TIMES DAILY DAYS 1 5 OF CYCLE 08/11/19  Yes [provider]  VITAMIN E PO Take by mouth daily.   Yes [provider]  ibuprofen (ADVIL) 800 MG tablet Take 1 tablet (800 mg total) by mouth every 8 (eight) hours as needed for moderate pain or cramping. 03/09/20   Long, Wonda Olds, MD  oxyCODONE-acetaminophen (PERCOCET/ROXICET) 5-325 MG tablet Take 1 tablet by mouth every 6 (six) hours as needed for severe pain. 03/09/20   Virgel Manifold, MD     Vital Signs: BP 122/78   Pulse 86   Temp 98.1 F (36.7 C) (Oral)   Resp 16   Wt 159 lb 3.2 oz (72.2 kg)   LMP 04/10/2020 Comment: serum preg.pending.04/25/20  SpO2 99%   BMI 26.49 kg/m   Physical Exam awake, alert.  Chest with distant but clear breath sounds bilaterally.  Heart with regular rate and rhythm.  Abdomen soft, positive bowel sounds, nontender.  No lower extremity edema.  Intact distal pulses.  Imaging: No results found.  Labs:  CBC: Recent Labs    02/09/20 1008 03/08/20 1638 04/25/20 0806  WBC 5.5 12.0* 5.7  HGB 8.7 Repeated and verified X2.* 8.1* 10.5*  HCT 27.5* 27.4* 33.1*  PLT 405.0* 386 473*    COAGS: Recent Labs    04/25/20 0806  INR 0.9    BMP: Recent Labs    02/09/20 1008 03/08/20 1638 04/25/20 0806  NA 139 138 135  K 4.2 3.7 3.6  CL 105 104 106  CO2 25 25 21*  GLUCOSE 90 102* 94  BUN 8 7 11   CALCIUM 9.2 9.4 9.0  CREATININE 0.75 0.71 0.70  GFRNONAA  --  >60 >60  GFRAA  --  >60 >60    LIVER FUNCTION TESTS: Recent Labs    03/08/20 1638  BILITOT 0.5  AST 22  ALT 16  ALKPHOS 55  PROT 8.6*  ALBUMIN 4.1    Assessment and Plan: Patient with history of symptomatic uterine fibroids, including a 4 cm intracavitary fibroid and 3.5 cm pedunculated fibroid.  She underwent tele consultation  with Dr. Anselm Pancoast on 01/02/2020 and was deemed an appropriate candidate for bilateral uterine artery embolization.  She presents today for the procedure.Risks and benefits of procedure were discussed with the patient including, but not limited to bleeding, infection, vascular injury or contrast induced renal failure.  This interventional procedure involves the use of X-rays and because of the nature of the planned procedure, it is possible that we will have prolonged use of X-ray fluoroscopy.  Potential radiation risks to you include (but are not limited to) the following: - A slightly elevated risk for cancer  several years later in life. This risk is typically less than 0.5% percent. This risk is low in comparison to the normal incidence of human cancer, which is 33% for women and 50% for men according to the [place]. - Radiation induced injury can include skin redness, resembling a rash, tissue breakdown / ulcers and hair loss (which can be temporary or permanent).   The likelihood of either of these occurring depends on the difficulty of the procedure and whether you are sensitive to radiation due to previous procedures, disease, or genetic conditions.   IF your procedure requires a prolonged use of radiation, you will be notified and given written instructions for further action.  It is your responsibility to monitor the irradiated area for the 2 weeks following the procedure and to notify your physician if you are concerned that you have suffered a radiation induced injury.    All of the patient's questions were answered, patient is agreeable to proceed.  Consent signed and in chart.  Post procedure she will be admitted to the hospital for overnight observation for pain control    Electronically Signed: D. Rowe Robert, PA-C 04/25/2020, 9:02 AM   I spent a total of 30 minutes at the the patient's bedside AND on the patient's hospital floor or unit, greater than 50% of which  was counseling/coordinating care for bilateral uterine artery embolization

## 2020-04-25 NOTE — Progress Notes (Signed)
[place], patients mother, to inform her that the patient was in her room now, 1619,6th floor and she may visit if she liked.  Patients mother voiced understanding.

## 2020-04-25 NOTE — Progress Notes (Signed)
Patient ID: Dominique Hancock, female   DOB: Dec 10, 1980, 39 y.o.   MRN: [id number] Pt doing fairly well ; has some pelvic cramping as expected post fibroid embolization; denies fever, resp issues, N/V VSS;AF Awake/alert; abd soft,mild tenderness to palpation lower abd region; puncture site rt CFA soft,NT, no hematoma; intact distal pulses   A/P: s/p bilat uterine artery embolization for symptomatic uterine fibroids; for overnight observation for pain control; Dilaudid PCA as needed; advance diet as tolerated; follow-up with Dr. Anselm Pancoast in [facility] via phone call in 3 to 4 weeks.  Follow-up pelvic MRI in 6 months.

## 2020-04-26 DIAGNOSIS — D259 Leiomyoma of uterus, unspecified: Secondary | ICD-10-CM | POA: Diagnosis not present

## 2020-04-26 MED ORDER — IBUPROFEN 600 MG PO TABS: 600.0000 mg | ORAL_TABLET | Freq: Four times a day (QID) | ORAL | 0 refills | Status: DC | PRN

## 2020-04-26 MED ORDER — PROMETHAZINE HCL 25 MG PO TABS: 25.0000 mg | ORAL_TABLET | Freq: Three times a day (TID) | ORAL | 0 refills | Status: DC | PRN

## 2020-04-26 MED ORDER — IBUPROFEN 200 MG PO TABS
600.0000 mg | ORAL_TABLET | Freq: Four times a day (QID) | ORAL | Status: DC | PRN
  Administered 2020-04-26: 600 mg via ORAL
  Filled 2020-04-26: qty 3

## 2020-04-26 MED ORDER — DOCUSATE SODIUM 100 MG PO CAPS: 100.0000 mg | ORAL_CAPSULE | Freq: Two times a day (BID) | ORAL | 0 refills | Status: DC

## 2020-04-26 MED ORDER — HYDROCODONE-ACETAMINOPHEN 5-325 MG PO TABS: 1.0000 | ORAL_TABLET | Freq: Four times a day (QID) | ORAL | 0 refills | Status: DC | PRN

## 2020-04-26 MED ORDER — HYDROCODONE-ACETAMINOPHEN 5-325 MG PO TABS
1.0000 | ORAL_TABLET | Freq: Four times a day (QID) | ORAL | Status: DC | PRN
  Administered 2020-04-26: 2 via ORAL
  Filled 2020-04-26: qty 2

## 2020-04-26 MED ORDER — ONDANSETRON HCL 4 MG/2ML IJ SOLN
4.0000 mg | Freq: Once | INTRAMUSCULAR | Status: AC
  Administered 2020-04-26: 4 mg via INTRAVENOUS
  Filled 2020-04-26: qty 2

## 2020-04-26 NOTE — Discharge Summary (Signed)
Patient ID: Dominique Hancock MRN: [id number] DOB/AGE: 04-11-1981 39 y.o.  Admit date: 04/25/2020 Discharge date: 04/26/2020  Supervising Physician: Markus Daft  Patient Status: [facility] - In-pt  Admission Diagnoses: Symptomatic uterine fibroids  Discharge Diagnoses: Symptomatic uterine fibroids, status post successful bilateral uterine artery embolization on 04/25/2020 Active Problems:   Uterine leiomyoma   Menorrhagia  Past Medical History:  Diagnosis Date  . Anemia   . Menorrhagia   . Submucous uterine fibroid   . Wears glasses    Past Surgical History:  Procedure Laterality Date  . DILATATION & CURETTAGE/HYSTEROSCOPY WITH MYOSURE N/A 04/17/2019   Procedure: DILATATION & CURETTAGE/HYSTEROSCOPY WITH MYOSURE XL;  Surgeon: Servando Salina, MD;  Location: [facility];  Service: Gynecology;  Laterality: N/A;  Request 1 hr.  Marland Kitchen DILATION AND CURETTAGE OF UTERUS  05-25-2007  @[facility]   . IR ANGIOGRAM PELVIS SELECTIVE OR SUPRASELECTIVE  04/25/2020  . IR ANGIOGRAM SELECTIVE EACH ADDITIONAL VESSEL  04/25/2020  . IR ANGIOGRAM SELECTIVE EACH ADDITIONAL VESSEL  04/25/2020  . IR EMBO TUMOR ORGAN ISCHEMIA INFARCT INC GUIDE ROADMAPPING  04/25/2020  . IR RADIOLOGIST EVAL & MGMT  01/02/2020  . IR US GUIDE VASC ACCESS RIGHT  04/25/2020     Discharged Condition: good  Hospital Course: Dominique Hancock is a 38 year old female with history of symptomatic uterine fibroids/menorrhagia who underwent tele consultation with Dr. Anselm Pancoast on 01/02/2020 to discuss treatment options.  She was deemed an appropriate candidate for bilateral uterine artery embolization and underwent the procedure at [facility] on 04/25/2020.  The procedure was performed without immediate complications and she was admitted for overnight observation for pain control.  She was placed on Dilaudid PCA pump.  Overnight the patient did fairly well with expected mild to moderate pelvic cramping.  On the day of discharge she did experience  some intermittent nausea and vomiting as well as persistent pelvic cramping.  She was given narcotics and antiemetics with improvement.  She did notice a small amount of blood on tissue after urinating but no discrete hematuria.  She was able to tolerate her lunch, ambulate and void without difficulty.  Findings were discussed with Dr. Anselm Pancoast and she was deemed stable for discharge at this time.  Prescriptions for Norco, Colace, Phenergan and ibuprofen were electronically prescribed for patient.  She will resume her home medications.  She will undergo follow-up with Dr.Henn at [facility] either via phone or in person in 3 to 4 weeks.  Follow-up pelvic MRI will be performed in 6 months.  She was told to contact our service with any additional questions.  Consults: none  Significant Diagnostic Studies:  Results for orders placed or performed during the hospital encounter of 57/84/69  Basic metabolic panel  Result Value Ref Range   Sodium 135 135 - 145 mmol/L   Potassium 3.6 3.5 - 5.1 mmol/L   Chloride 106 98 - 111 mmol/L   CO2 21 (L) 22 - 32 mmol/L   Glucose, Bld 94 70 - 99 mg/dL   BUN 11 6 - 20 mg/dL   Creatinine, Ser 0.70 0.44 - 1.00 mg/dL   Calcium 9.0 8.9 - 10.3 mg/dL   GFR calc non Af Amer >60 >60 mL/min   GFR calc Af Amer >60 >60 mL/min   Anion gap 8 5 - 15  CBC with Differential/Platelet  Result Value Ref Range   WBC 5.7 4.0 - 10.5 K/uL   RBC 4.02 3.87 - 5.11 MIL/uL   Hemoglobin 10.5 (L) 12.0 - 15.0  g/dL   HCT 33.1 (L) 36 - 46 %   MCV 82.3 80.0 - 100.0 fL   MCH 26.1 26.0 - 34.0 pg   MCHC 31.7 30.0 - 36.0 g/dL   RDW 22.7 (H) 11.5 - 15.5 %   Platelets 473 (H) 150 - 400 K/uL   nRBC 0.0 0.0 - 0.2 %   Neutrophils Relative % 58 %   Neutro Abs 3.4 1.7 - 7.7 K/uL   Lymphocytes Relative 32 %   Lymphs Abs 1.8 0.7 - 4.0 K/uL   Monocytes Relative 8 %   Monocytes Absolute 0.4 0 - 1 K/uL   Eosinophils Relative 1 %   Eosinophils Absolute 0.0 0 - 0 K/uL   Basophils Relative 1 %   Basophils  Absolute 0.0 0 - 0 K/uL   Immature Granulocytes 0 %   Abs Immature Granulocytes 0.01 0.00 - 0.07 K/uL   Burr Cells PRESENT    Ovalocytes PRESENT   hCG, serum, qualitative  Result Value Ref Range   Preg, Serum NEGATIVE NEGATIVE  Protime-INR  Result Value Ref Range   Prothrombin Time 12.2 11.4 - 15.2 seconds   INR 0.9 0.8 - 1.2     Treatments: Bilateral uterine artery embolization via IV conscious sedation on 04/25/2020  Discharge Exam: Blood pressure 128/79, pulse 63, temperature 98.4 F (36.9 C), temperature source Oral, resp. rate 14, weight 159 lb 3.2 oz (72.2 kg), last menstrual period 04/10/2020, SpO2 99 %. Awake, alert.  Chest clear to auscultation bilaterally.  Heart with regular rate and rhythm.  Abdomen soft, positive bowel sounds, some mild lower abdominal tenderness to palpation.  Puncture site right groin soft, no hematoma, mildly tender to palpation.  No lower extremity edema.  Intact distal pulses.  Disposition: Discharge disposition: 01-Home or Self Care       Discharge Instructions    Call MD for:  difficulty breathing, headache or visual disturbances   Complete by: As directed    Call MD for:  extreme fatigue   Complete by: As directed    Call MD for:  hives   Complete by: As directed    Call MD for:  persistant dizziness or light-headedness   Complete by: As directed    Call MD for:  persistant nausea and vomiting   Complete by: As directed    Call MD for:  redness, tenderness, or signs of infection (pain, swelling, redness, odor or green/yellow discharge around incision site)   Complete by: As directed    Call MD for:  severe uncontrolled pain   Complete by: As directed    Call MD for:  temperature >100.4   Complete by: As directed    Change dressing (specify)   Complete by: As directed    May change bandage over puncture site right groin daily for the next 2 to 3 days.  May wash site with soap and water.   Diet - low sodium heart healthy   Complete  by: As directed    Discharge instructions   Complete by: As directed    Stay well-hydrated, resume home medications, may use heating pad as needed to pelvic region for cramping   Driving Restrictions   Complete by: As directed    No driving for the next 24 hours or after taking narcotic medication   Increase activity slowly   Complete by: As directed    Lifting restrictions   Complete by: As directed    No heavy lifting for the next 3 to 4  days   May shower / Bathe   Complete by: As directed    May walk up steps   Complete by: As directed    Sexual Activity Restrictions   Complete by: As directed    No sexual intercourse for 1 week     Allergies as of 04/26/2020   No Known Allergies     Medication List    STOP taking these medications   naproxen sodium 220 MG tablet Commonly known as: ALEVE   oxyCODONE-acetaminophen 5-325 MG tablet Commonly known as: PERCOCET/ROXICET     TAKE these medications   docusate sodium 100 MG capsule Commonly known as: COLACE Take 1 capsule (100 mg total) by mouth 2 (two) times daily.   ferrous sulfate 325 (65 FE) MG tablet Take 325 mg by mouth daily with breakfast.   HYDROcodone-acetaminophen 5-325 MG tablet Commonly known as: NORCO/VICODIN Take 1-2 tablets by mouth every 6 (six) hours as needed for moderate pain.   ibuprofen 600 MG tablet Commonly known as: ADVIL Take 1 tablet (600 mg total) by mouth every 6 (six) hours as needed for moderate pain. What changed:   medication strength  how much to take  when to take this  reasons to take this   MELATONIN PO Take 1 tablet by mouth at bedtime as needed (sleep).   promethazine 25 MG tablet Commonly known as: PHENERGAN Take 1 tablet (25 mg total) by mouth every 8 (eight) hours as needed for nausea.   Sronyx 0.1-20 MG-MCG tablet Generic drug: levonorgestrel-ethinyl estradiol Take 1 tablet by mouth daily.   VITAMIN D PO Take 1 capsule by mouth daily.              Discharge Care Instructions  (From admission, onward)         Start     Ordered   04/26/20 0000  Change dressing (specify)       Comments: May change bandage over puncture site right groin daily for the next 2 to 3 days.  May wash site with soap and water.   04/26/20 1612          Follow-up Information    Markus Daft, MD Follow up.   Specialties: Interventional Radiology, Radiology Why: Radiology will contact you in 3 to 4 weeks regarding follow-up with Dr. Anselm Pancoast; call [phone] or [phone] with any questions Contact information: [address] [place] [address] [phone]        Servando Salina, MD Follow up.   Specialty: Obstetrics and Gynecology Why: Continue follow-up with Dr. Garwin Brothers as scheduled Contact information: [address] [place] [address] [phone]                Electronically Signed: D. Rowe Robert, PA-C 04/26/2020, 4:15 PM   I have spent Less Than 30 Minutes discharging [employer].

## 2020-04-26 NOTE — Discharge Instructions (Signed)
Uterine Artery Embolization for Fibroids, Care After °This sheet gives you information about how to care for yourself after your procedure. Your health care provider may also give you more specific instructions. If you have problems or questions, contact your health care provider. °What can I expect after the procedure? °After your procedure, it is common to have: °· Pelvic cramping. You will be given pain medicine. °· Nausea and vomiting. You may be given medicine to help relieve nausea. °Follow these instructions at home: °Incision care °· Follow instructions from your health care provider about how to take care of your incision. Make sure you: °? Wash your hands with soap and water before you change your bandage (dressing). If soap and water are not available, use hand sanitizer. °? Change your dressing as told by your health care provider. °· Check your incision area every day for signs of infection. Check for: °? More redness, swelling, or pain. °? More fluid or blood. °? Warmth. °? Pus or a bad smell. °Medicines ° °· Take over-the-counter and prescription medicines only as told by your health care provider. °· Do not take aspirin. It can cause bleeding. °· Do not drive for 24 hours if you were given a medicine to help you relax (sedative). °· Do not drive or use heavy machinery while taking prescription pain medicine. °General instructions °· Ask your health care provider when you can resume sexual activity. °· To prevent or treat constipation while you are taking prescription pain medicine, your health care provider may recommend that you: °? Drink enough fluid to keep your urine clear or pale yellow. °? Take over-the-counter or prescription medicines. °? Eat foods that are high in fiber, such as fresh fruits and vegetables, whole grains, and beans. °? Limit foods that are high in fat and processed sugars, such as fried and sweet foods. °Contact a health care provider if: °· You have a fever. °· You have more  redness, swelling, or pain around your incision site. °· You have more fluid or blood coming from your incision site. °· Your incision feels warm to the touch. °· You have pus or a bad smell coming from your incision. °· You have a rash. °· You have uncontrolled nausea or you cannot eat or drink anything without vomiting. °Get help right away if: °· You have trouble breathing. °· You have chest pain. °· You have severe abdominal pain. °· You have leg pain. °· You become dizzy and faint. °Summary °· After your procedure, it is common to have pelvic cramping. You will be given pain medicine. °· Follow instructions from your health care provider about how to take care of your incision. °· Check your incision area every day for signs of infection. °· Take over-the-counter and prescription medicines only as told by your health care provider. °This information is not intended to replace advice given to you by your health care provider. Make sure you discuss any questions you have with your health care provider. °Document Revised: 07/16/2017 Document Reviewed: 11/05/2016 °[employer]vier Patient Education © 2020 [employer] ° °

## 2020-05-14 ENCOUNTER — Ambulatory Visit
Admission: RE | Admit: 2020-05-14 | Discharge: 2020-05-14 | Disposition: A | Discharge status: Home / Self Care | Payer: Managed Care, Other (non HMO) | Source: Ambulatory Visit | Attending: Radiology | Admitting: Radiology

## 2020-05-14 ENCOUNTER — Encounter: Payer: Self-pay | Admitting: *Deleted

## 2020-05-14 ENCOUNTER — Other Ambulatory Visit: Payer: Self-pay

## 2020-05-14 DIAGNOSIS — D259 Leiomyoma of uterus, unspecified: Secondary | ICD-10-CM

## 2020-05-14 HISTORY — PX: IR RADIOLOGIST EVAL & MGMT: IMG5224

## 2020-05-14 NOTE — Progress Notes (Signed)
Chief Complaint: Patient was consulted remotely today (TeleHealth) for follow-up after uterine artery embolization.  Referring Physician(s): Garwin Brothers, Sheronette  History of Present Illness: Dominique Hancock is a 39 y.o. female with history of abnormal uterine bleeding, uterine fibroids and menorrhagia.  History of hysteroscopy and resection of a submucosal fibroid on 04/17/2019.  Patient had persistent prolonged bleeding after the surgery.  Patient underwent bilateral uterine artery embolization on 04/25/2020.  Patient had expected pain and cramping after the procedure.  Patient said that the first week after the procedure was tough with regards to the pain and discomfort.  She continues to have intermittent spotting but this has decreased since the procedure.  She is still on her birth control pills.  After the procedure she noted some tightness in her right leg with walking.  She says that the right leg symptoms have essentially resolved.  She has resumed walking and exercise.  She feels that she has different sensations with her bladder and voiding.  She says that it is harder to know when her bladder is full.  She has some discomfort from urinating but she feels like this is related to a full bladder.  She does not feel like she has having urinary tract symptoms.  Past Medical History:  Diagnosis Date  . Anemia   . Menorrhagia   . Submucous uterine fibroid   . Wears glasses     Past Surgical History:  Procedure Laterality Date  . DILATATION & CURETTAGE/HYSTEROSCOPY WITH MYOSURE N/A 04/17/2019   Procedure: DILATATION & CURETTAGE/HYSTEROSCOPY WITH MYOSURE XL;  Surgeon: Servando Salina, MD;  Location: [facility];  Service: Gynecology;  Laterality: N/A;  Request 1 hr.  Marland Kitchen DILATION AND CURETTAGE OF UTERUS  05-25-2007  @[facility]   . IR ANGIOGRAM PELVIS SELECTIVE OR SUPRASELECTIVE  04/25/2020  . IR ANGIOGRAM SELECTIVE EACH ADDITIONAL VESSEL  04/25/2020  . IR ANGIOGRAM  SELECTIVE EACH ADDITIONAL VESSEL  04/25/2020  . IR EMBO TUMOR ORGAN ISCHEMIA INFARCT INC GUIDE ROADMAPPING  04/25/2020  . IR RADIOLOGIST EVAL & MGMT  01/02/2020  . IR US GUIDE VASC ACCESS RIGHT  04/25/2020    Allergies: Patient has no known allergies.  Medications: Prior to Admission medications   Medication Sig Start Date End Date Taking? Authorizing Provider  docusate sodium (COLACE) 100 MG capsule Take 1 capsule (100 mg total) by mouth 2 (two) times daily. 04/26/20   Allred, Darrell K, PA-C  ferrous sulfate 325 (65 FE) MG tablet Take 325 mg by mouth daily with breakfast.    [provider]  HYDROcodone-acetaminophen (NORCO/VICODIN) 5-325 MG tablet Take 1-2 tablets by mouth every 6 (six) hours as needed for moderate pain. 04/26/20   Allred, Darrell K, PA-C  ibuprofen (ADVIL) 600 MG tablet Take 1 tablet (600 mg total) by mouth every 6 (six) hours as needed for moderate pain. 04/26/20   Allred, Darrell K, PA-C  MELATONIN PO Take 1 tablet by mouth at bedtime as needed (sleep).    [provider]  promethazine (PHENERGAN) 25 MG tablet Take 1 tablet (25 mg total) by mouth every 8 (eight) hours as needed for nausea. 04/26/20   Allred, Darrell K, PA-C  SRONYX 0.1-20 MG-MCG tablet Take 1 tablet by mouth daily. 11/20/19   [provider]  VITAMIN D PO Take 1 capsule by mouth daily.    [provider]     No family history on file.  Social History   Socioeconomic History  . Marital status: Single  Spouse name: Not on file  . Number of children: 1  . Years of education: Not on file  . Highest education level: Not on file  Occupational History  . Not on file  Tobacco Use  . Smoking status: Never Smoker  . Smokeless tobacco: Never Used  Vaping Use  . Vaping Use: Never assessed  Substance and Sexual Activity  . Alcohol use: Yes    Comment: occasional  . Drug use: Never  . Sexual activity: Not on file  Other Topics Concern  . Not on file  Social History  Narrative  . Not on file   Social Determinants of Health   Financial Resource Strain:   . Difficulty of Paying Living Expenses: Not on file  Food Insecurity:   . Worried About Charity fundraiser in the Last Year: Not on file  . Ran Out of Food in the Last Year: Not on file  Transportation Needs:   . Lack of Transportation (Medical): Not on file  . Lack of Transportation (Non-Medical): Not on file  Physical Activity:   . Days of Exercise per Week: Not on file  . Minutes of Exercise per Session: Not on file  Stress:   . Feeling of Stress : Not on file  Social Connections:   . Frequency of Communication with Friends and Family: Not on file  . Frequency of Social Gatherings with Friends and Family: Not on file  . Attends Religious Services: Not on file  . Active Member of Clubs or Organizations: Not on file  . Attends Archivist Meetings: Not on file  . Marital Status: Not on file    Review of Systems  Genitourinary: Positive for difficulty urinating, menstrual problem and vaginal bleeding.     Physical Exam No direct physical exam was performed  Vital Signs: There were no vitals taken for this visit.  Imaging: IR Angiogram Pelvis Selective Or Supraselective  Result Date: 04/25/2020 INDICATION: 39 year old with uterine fibroids and menorrhagia. EXAM: 1. PELVIC ANGIOGRAPHY INCLUDING BILATERAL INTERNAL ILIAC ARTERIES AND BILATERAL UTERINE ARTERIES. 2. PARTICLE EMBOLIZATION OF BILATERAL UTERINE ARTERIES. 3. ULTRASOUND GUIDANCE FOR VASCULAR ACCESS MEDICATIONS: Ancef 2 g. The antibiotic was administered within 1 hour of the procedure ANESTHESIA/SEDATION: Fentanyl 200 mcg IV; Versed 4.0 mg IV Moderate Sedation Time:  67 minutes The patient was continuously monitored during the procedure by the interventional radiology nurse under my direct supervision. CONTRAST:  80 mL Omnipaque 300 FLUOROSCOPY TIME:  Fluoroscopy Time: 19 minutes, 258 mGy COMPLICATIONS: None immediate.  PROCEDURE: Informed consent was obtained from the patient following explanation of the procedure, risks, benefits and alternatives. The patient understands, agrees and consents for the procedure. All questions were addressed. A time out was performed prior to the initiation of the procedure. The patient was placed supine on the interventional table. The patient had palpable pedal pulses. Ultrasound confirmed a patent right common femoral artery. Ultrasound image was saved for documentation. The right groin was prepped and draped in a sterile fashion. Maximal barrier sterile technique was utilized including caps, mask, sterile gowns, sterile gloves, sterile drape, hand hygiene and skin antiseptic. The skin was anesthetized 1% lidocaine. Using ultrasound guidance, 21 gauge needle was directed in the right common femoral artery and a micropuncture dilator set was placed. The vascular access was upsized to a 5-French vascular sheath. A Cobra catheter was used to cannulate the left common iliac artery and the left internal iliac artery. A series of arteriograms were performed to identify the left uterine  artery orfice. A high-flow Renegade microcatheter was advanced into the left uterine artery. 1 vial of Embospheres (500 - 700 micron) were injected through the microcatheter with fluoroscopic guidance. There was near complete stasis of the left uterine artery following administration of the particles. A Waltman's loop was formed using the Cobra catheter and the right internal iliac artery was cannulated. The right uterine artery was identified with contrast angiograms. The microcatheter was advanced into the right uterine artery and a series of angiograms were performed. 1.3 vials of Embospheres (500-700 micron) were injected through the microcatheter with fluoroscopic guidance. There was near complete stasis of the right uterine artery at the end of the procedure. The microcatheter was removed. The Cobra catheter was  straightened out over the aortic bifurcation and removed over a wire. Angiogram was performed through the right groin sheath. Right groin sheath was removed using Angio-Seal closure device. Right groin hemostasis at the end of the procedure. FINDINGS: Large uterine arteries bilaterally. Near complete stasis of the uterine arteries at the end of the procedure. Fluoroscopic images were obtained for documentation. IMPRESSION: Successful uterine artery embolization procedure. Electronically Signed   By: Markus Daft M.D.   On: 04/25/2020 12:11   IR Angiogram Selective Each Additional Vessel  Result Date: 04/25/2020 INDICATION: 39 year old with uterine fibroids and menorrhagia. EXAM: 1. PELVIC ANGIOGRAPHY INCLUDING BILATERAL INTERNAL ILIAC ARTERIES AND BILATERAL UTERINE ARTERIES. 2. PARTICLE EMBOLIZATION OF BILATERAL UTERINE ARTERIES. 3. ULTRASOUND GUIDANCE FOR VASCULAR ACCESS MEDICATIONS: Ancef 2 g. The antibiotic was administered within 1 hour of the procedure ANESTHESIA/SEDATION: Fentanyl 200 mcg IV; Versed 4.0 mg IV Moderate Sedation Time:  67 minutes The patient was continuously monitored during the procedure by the interventional radiology nurse under my direct supervision. CONTRAST:  80 mL Omnipaque 300 FLUOROSCOPY TIME:  Fluoroscopy Time: 19 minutes, 177 mGy COMPLICATIONS: None immediate. PROCEDURE: Informed consent was obtained from the patient following explanation of the procedure, risks, benefits and alternatives. The patient understands, agrees and consents for the procedure. All questions were addressed. A time out was performed prior to the initiation of the procedure. The patient was placed supine on the interventional table. The patient had palpable pedal pulses. Ultrasound confirmed a patent right common femoral artery. Ultrasound image was saved for documentation. The right groin was prepped and draped in a sterile fashion. Maximal barrier sterile technique was utilized including caps, mask, sterile  gowns, sterile gloves, sterile drape, hand hygiene and skin antiseptic. The skin was anesthetized 1% lidocaine. Using ultrasound guidance, 21 gauge needle was directed in the right common femoral artery and a micropuncture dilator set was placed. The vascular access was upsized to a 5-French vascular sheath. A Cobra catheter was used to cannulate the left common iliac artery and the left internal iliac artery. A series of arteriograms were performed to identify the left uterine artery orfice. A high-flow Renegade microcatheter was advanced into the left uterine artery. 1 vial of Embospheres (500 - 700 micron) were injected through the microcatheter with fluoroscopic guidance. There was near complete stasis of the left uterine artery following administration of the particles. A Waltman's loop was formed using the Cobra catheter and the right internal iliac artery was cannulated. The right uterine artery was identified with contrast angiograms. The microcatheter was advanced into the right uterine artery and a series of angiograms were performed. 1.3 vials of Embospheres (500-700 micron) were injected through the microcatheter with fluoroscopic guidance. There was near complete stasis of the right uterine artery at the end of the procedure.  The microcatheter was removed. The Cobra catheter was straightened out over the aortic bifurcation and removed over a wire. Angiogram was performed through the right groin sheath. Right groin sheath was removed using Angio-Seal closure device. Right groin hemostasis at the end of the procedure. FINDINGS: Large uterine arteries bilaterally. Near complete stasis of the uterine arteries at the end of the procedure. Fluoroscopic images were obtained for documentation. IMPRESSION: Successful uterine artery embolization procedure. Electronically Signed   By: Markus Daft M.D.   On: 04/25/2020 12:11   IR Angiogram Selective Each Additional Vessel  Result Date: 04/25/2020 INDICATION:  39 year old with uterine fibroids and menorrhagia. EXAM: 1. PELVIC ANGIOGRAPHY INCLUDING BILATERAL INTERNAL ILIAC ARTERIES AND BILATERAL UTERINE ARTERIES. 2. PARTICLE EMBOLIZATION OF BILATERAL UTERINE ARTERIES. 3. ULTRASOUND GUIDANCE FOR VASCULAR ACCESS MEDICATIONS: Ancef 2 g. The antibiotic was administered within 1 hour of the procedure ANESTHESIA/SEDATION: Fentanyl 200 mcg IV; Versed 4.0 mg IV Moderate Sedation Time:  67 minutes The patient was continuously monitored during the procedure by the interventional radiology nurse under my direct supervision. CONTRAST:  80 mL Omnipaque 300 FLUOROSCOPY TIME:  Fluoroscopy Time: 19 minutes, 423 mGy COMPLICATIONS: None immediate. PROCEDURE: Informed consent was obtained from the patient following explanation of the procedure, risks, benefits and alternatives. The patient understands, agrees and consents for the procedure. All questions were addressed. A time out was performed prior to the initiation of the procedure. The patient was placed supine on the interventional table. The patient had palpable pedal pulses. Ultrasound confirmed a patent right common femoral artery. Ultrasound image was saved for documentation. The right groin was prepped and draped in a sterile fashion. Maximal barrier sterile technique was utilized including caps, mask, sterile gowns, sterile gloves, sterile drape, hand hygiene and skin antiseptic. The skin was anesthetized 1% lidocaine. Using ultrasound guidance, 21 gauge needle was directed in the right common femoral artery and a micropuncture dilator set was placed. The vascular access was upsized to a 5-French vascular sheath. A Cobra catheter was used to cannulate the left common iliac artery and the left internal iliac artery. A series of arteriograms were performed to identify the left uterine artery orfice. A high-flow Renegade microcatheter was advanced into the left uterine artery. 1 vial of Embospheres (500 - 700 micron) were injected  through the microcatheter with fluoroscopic guidance. There was near complete stasis of the left uterine artery following administration of the particles. A Waltman's loop was formed using the Cobra catheter and the right internal iliac artery was cannulated. The right uterine artery was identified with contrast angiograms. The microcatheter was advanced into the right uterine artery and a series of angiograms were performed. 1.3 vials of Embospheres (500-700 micron) were injected through the microcatheter with fluoroscopic guidance. There was near complete stasis of the right uterine artery at the end of the procedure. The microcatheter was removed. The Cobra catheter was straightened out over the aortic bifurcation and removed over a wire. Angiogram was performed through the right groin sheath. Right groin sheath was removed using Angio-Seal closure device. Right groin hemostasis at the end of the procedure. FINDINGS: Large uterine arteries bilaterally. Near complete stasis of the uterine arteries at the end of the procedure. Fluoroscopic images were obtained for documentation. IMPRESSION: Successful uterine artery embolization procedure. Electronically Signed   By: Markus Daft M.D.   On: 04/25/2020 12:11   IR US Guide Vasc Access Right  Result Date: 04/25/2020 INDICATION: 39 year old with uterine fibroids and menorrhagia. EXAM: 1. PELVIC ANGIOGRAPHY INCLUDING BILATERAL INTERNAL ILIAC  ARTERIES AND BILATERAL UTERINE ARTERIES. 2. PARTICLE EMBOLIZATION OF BILATERAL UTERINE ARTERIES. 3. ULTRASOUND GUIDANCE FOR VASCULAR ACCESS MEDICATIONS: Ancef 2 g. The antibiotic was administered within 1 hour of the procedure ANESTHESIA/SEDATION: Fentanyl 200 mcg IV; Versed 4.0 mg IV Moderate Sedation Time:  67 minutes The patient was continuously monitored during the procedure by the interventional radiology nurse under my direct supervision. CONTRAST:  80 mL Omnipaque 300 FLUOROSCOPY TIME:  Fluoroscopy Time: 19 minutes, 536 mGy  COMPLICATIONS: None immediate. PROCEDURE: Informed consent was obtained from the patient following explanation of the procedure, risks, benefits and alternatives. The patient understands, agrees and consents for the procedure. All questions were addressed. A time out was performed prior to the initiation of the procedure. The patient was placed supine on the interventional table. The patient had palpable pedal pulses. Ultrasound confirmed a patent right common femoral artery. Ultrasound image was saved for documentation. The right groin was prepped and draped in a sterile fashion. Maximal barrier sterile technique was utilized including caps, mask, sterile gowns, sterile gloves, sterile drape, hand hygiene and skin antiseptic. The skin was anesthetized 1% lidocaine. Using ultrasound guidance, 21 gauge needle was directed in the right common femoral artery and a micropuncture dilator set was placed. The vascular access was upsized to a 5-French vascular sheath. A Cobra catheter was used to cannulate the left common iliac artery and the left internal iliac artery. A series of arteriograms were performed to identify the left uterine artery orfice. A high-flow Renegade microcatheter was advanced into the left uterine artery. 1 vial of Embospheres (500 - 700 micron) were injected through the microcatheter with fluoroscopic guidance. There was near complete stasis of the left uterine artery following administration of the particles. A Waltman's loop was formed using the Cobra catheter and the right internal iliac artery was cannulated. The right uterine artery was identified with contrast angiograms. The microcatheter was advanced into the right uterine artery and a series of angiograms were performed. 1.3 vials of Embospheres (500-700 micron) were injected through the microcatheter with fluoroscopic guidance. There was near complete stasis of the right uterine artery at the end of the procedure. The microcatheter was  removed. The Cobra catheter was straightened out over the aortic bifurcation and removed over a wire. Angiogram was performed through the right groin sheath. Right groin sheath was removed using Angio-Seal closure device. Right groin hemostasis at the end of the procedure. FINDINGS: Large uterine arteries bilaterally. Near complete stasis of the uterine arteries at the end of the procedure. Fluoroscopic images were obtained for documentation. IMPRESSION: Successful uterine artery embolization procedure. Electronically Signed   By: Markus Daft M.D.   On: 04/25/2020 12:11   IR EMBO TUMOR ORGAN ISCHEMIA INFARCT INC GUIDE ROADMAPPING  Result Date: 04/25/2020 INDICATION: 39 year old with uterine fibroids and menorrhagia. EXAM: 1. PELVIC ANGIOGRAPHY INCLUDING BILATERAL INTERNAL ILIAC ARTERIES AND BILATERAL UTERINE ARTERIES. 2. PARTICLE EMBOLIZATION OF BILATERAL UTERINE ARTERIES. 3. ULTRASOUND GUIDANCE FOR VASCULAR ACCESS MEDICATIONS: Ancef 2 g. The antibiotic was administered within 1 hour of the procedure ANESTHESIA/SEDATION: Fentanyl 200 mcg IV; Versed 4.0 mg IV Moderate Sedation Time:  67 minutes The patient was continuously monitored during the procedure by the interventional radiology nurse under my direct supervision. CONTRAST:  80 mL Omnipaque 300 FLUOROSCOPY TIME:  Fluoroscopy Time: 19 minutes, 644 mGy COMPLICATIONS: None immediate. PROCEDURE: Informed consent was obtained from the patient following explanation of the procedure, risks, benefits and alternatives. The patient understands, agrees and consents for the procedure. All questions were addressed. A  time out was performed prior to the initiation of the procedure. The patient was placed supine on the interventional table. The patient had palpable pedal pulses. Ultrasound confirmed a patent right common femoral artery. Ultrasound image was saved for documentation. The right groin was prepped and draped in a sterile fashion. Maximal barrier sterile technique  was utilized including caps, mask, sterile gowns, sterile gloves, sterile drape, hand hygiene and skin antiseptic. The skin was anesthetized 1% lidocaine. Using ultrasound guidance, 21 gauge needle was directed in the right common femoral artery and a micropuncture dilator set was placed. The vascular access was upsized to a 5-French vascular sheath. A Cobra catheter was used to cannulate the left common iliac artery and the left internal iliac artery. A series of arteriograms were performed to identify the left uterine artery orfice. A high-flow Renegade microcatheter was advanced into the left uterine artery. 1 vial of Embospheres (500 - 700 micron) were injected through the microcatheter with fluoroscopic guidance. There was near complete stasis of the left uterine artery following administration of the particles. A Waltman's loop was formed using the Cobra catheter and the right internal iliac artery was cannulated. The right uterine artery was identified with contrast angiograms. The microcatheter was advanced into the right uterine artery and a series of angiograms were performed. 1.3 vials of Embospheres (500-700 micron) were injected through the microcatheter with fluoroscopic guidance. There was near complete stasis of the right uterine artery at the end of the procedure. The microcatheter was removed. The Cobra catheter was straightened out over the aortic bifurcation and removed over a wire. Angiogram was performed through the right groin sheath. Right groin sheath was removed using Angio-Seal closure device. Right groin hemostasis at the end of the procedure. FINDINGS: Large uterine arteries bilaterally. Near complete stasis of the uterine arteries at the end of the procedure. Fluoroscopic images were obtained for documentation. IMPRESSION: Successful uterine artery embolization procedure. Electronically Signed   By: Markus Daft M.D.   On: 04/25/2020 12:11    Labs:  CBC: Recent Labs     02/09/20 1008 03/08/20 1638 04/25/20 0806  WBC 5.5 12.0* 5.7  HGB 8.7 Repeated and verified X2.* 8.1* 10.5*  HCT 27.5* 27.4* 33.1*  PLT 405.0* 386 473*    COAGS: Recent Labs    04/25/20 0806  INR 0.9    BMP: Recent Labs    02/09/20 1008 03/08/20 1638 04/25/20 0806  NA 139 138 135  K 4.2 3.7 3.6  CL 105 104 106  CO2 25 25 21*  GLUCOSE 90 102* 94  BUN 8 7 11   CALCIUM 9.2 9.4 9.0  CREATININE 0.75 0.71 0.70  GFRNONAA  --  >60 >60  GFRAA  --  >60 >60    LIVER FUNCTION TESTS: Recent Labs    03/08/20 1638  BILITOT 0.5  AST 22  ALT 16  ALKPHOS 55  PROT 8.6*  ALBUMIN 4.1    TUMOR MARKERS: No results for input(s): AFPTM, CEA, CA199, CHROMGRNA in the last 8760 hours.  Assessment and Plan:  39 year old with uterine fibroids and history of abnormal uterine bleeding and menorrhagia.  Patient underwent uterine artery embolization on 04/25/2020.  Patient has done very well following the procedure with expected pain and discomfort approximately 1 week after the procedure.  Patient continues to have intermittent spotting but this has decreased since the procedure.  Her main complaint is a change in sensation with her bladder and voiding.  It is possible that this is related to the  Foley catheter during the procedure but it may also be related to changes in the uterus following the embolization.  Her uterus was sitting on top of the urinary bladder and its possible that as the uterus changes after the embolization,  sensation in the bladder may also change.  I anticipate that her urinary sensations will normalize over time.  Patient is interested in stopping her birth control pills now that she has had the embolization procedure.  I have no problems with her stopping the medication but told her that she should coordinate with her gynecologist.  Plan for follow-up visit and a pelvic MRI in 6 months.  She will contact us if she has any questions or concerns in the interim.  Thank you  for this interesting consult.  I greatly enjoyed meeting Tayana Shankle and look forward to participating in their care.  A copy of this report was sent to the requesting provider on this date.  Electronically Signed: Burman Riis 05/14/2020, 11:13 AM   I spent a total of   8 minutes in remote  clinical consultation, greater than 50% of which was counseling/coordinating care for uterine fibroids.    Visit type: Audio only (telephone). Audio (no video) only due to Patient preference.. Alternative for in-person consultation at [facility], [address], [place], [place]. This visit type was conducted due to national recommendations for restrictions regarding the COVID-19 Pandemic (e.g. social distancing).  This format is felt to be most appropriate for this patient at this time.  All issues noted in this document were discussed and addressed.

## 2020-06-11 ENCOUNTER — Emergency Department (HOSPITAL_COMMUNITY): Payer: Managed Care, Other (non HMO)

## 2020-06-11 ENCOUNTER — Emergency Department (HOSPITAL_COMMUNITY)
Admission: EM | Admit: 2020-06-11 | Discharge: 2020-06-11 | Disposition: A | Discharge status: Home / Self Care | Payer: Managed Care, Other (non HMO) | Attending: Emergency Medicine | Admitting: Emergency Medicine

## 2020-06-11 ENCOUNTER — Encounter (HOSPITAL_COMMUNITY): Payer: Self-pay

## 2020-06-11 ENCOUNTER — Other Ambulatory Visit: Payer: Self-pay

## 2020-06-11 DIAGNOSIS — R0789 Other chest pain: Secondary | ICD-10-CM | POA: Insufficient documentation

## 2020-06-11 DIAGNOSIS — J029 Acute pharyngitis, unspecified: Secondary | ICD-10-CM | POA: Diagnosis not present

## 2020-06-11 DIAGNOSIS — R079 Chest pain, unspecified: Secondary | ICD-10-CM

## 2020-06-11 DIAGNOSIS — Z20822 Contact with and (suspected) exposure to covid-19: Secondary | ICD-10-CM | POA: Diagnosis not present

## 2020-06-11 LAB — COMPREHENSIVE METABOLIC PANEL
ALT: 14 U/L (ref 0–44)
AST: 16 U/L (ref 15–41)
Albumin: 3.8 g/dL (ref 3.5–5.0)
Alkaline Phosphatase: 49 U/L (ref 38–126)
Anion gap: 10 (ref 5–15)
BUN: 8 mg/dL (ref 6–20)
CO2: 24 mmol/L (ref 22–32)
Calcium: 9.5 mg/dL (ref 8.9–10.3)
Chloride: 106 mmol/L (ref 98–111)
Creatinine, Ser: 0.64 mg/dL (ref 0.44–1.00)
GFR, Estimated: >60 mL/min (ref >60)
Glucose, Bld: 98 mg/dL (ref 70–99)
Potassium: 3.8 mmol/L (ref 3.5–5.1)
Sodium: 140 mmol/L (ref 135–145)
Total Bilirubin: 0.5 mg/dL (ref 0.3–1.2)
Total Protein: 7.9 g/dL (ref 6.5–8.1)

## 2020-06-11 LAB — CBC
HCT: 37 % (ref 36.0–46.0)
Hemoglobin: 12.1 g/dL (ref 12.0–15.0)
MCH: 29 pg (ref 26.0–34.0)
MCHC: 32.7 g/dL (ref 30.0–36.0)
MCV: 88.7 fL (ref 80.0–100.0)
Platelets: 372 10*3/uL (ref 150–400)
RBC: 4.17 MIL/uL (ref 3.87–5.11)
RDW: 17 % — ABNORMAL HIGH (ref 11.5–15.5)
WBC: 9.5 10*3/uL (ref 4.0–10.5)
nRBC: 0 % (ref 0.0–0.2)

## 2020-06-11 LAB — RESPIRATORY PANEL BY RT PCR (FLU A&B, COVID)
Influenza A by PCR: NEGATIVE
Influenza B by PCR: NEGATIVE
SARS Coronavirus 2 by RT PCR: NEGATIVE

## 2020-06-11 LAB — TROPONIN I (HIGH SENSITIVITY): Troponin I (High Sensitivity): <2 ng/L (ref <18)

## 2020-06-11 LAB — HCG, QUANTITATIVE, PREGNANCY: hCG, Beta Chain, Quant, S: <1 m[IU]/mL (ref <5)

## 2020-06-11 LAB — D-DIMER, QUANTITATIVE: D-Dimer, Quant: 0.4 ug/mL-FEU (ref 0.00–0.50)

## 2020-06-11 LAB — LIPASE, BLOOD: Lipase: 25 U/L (ref 11–51)

## 2020-06-11 MED ORDER — KETOROLAC TROMETHAMINE 15 MG/ML IJ SOLN
15.0000 mg | Freq: Once | INTRAMUSCULAR | Status: AC
  Administered 2020-06-11: 15 mg via INTRAVENOUS
  Filled 2020-06-11: qty 1

## 2020-06-11 MED ORDER — NAPROXEN 375 MG PO TABS: 375.0000 mg | ORAL_TABLET | Freq: Two times a day (BID) | ORAL | 0 refills | Status: DC

## 2020-06-11 NOTE — ED Notes (Signed)
An After Visit Summary was printed and given to the patient. Discharge instructions given and no further questions at this time.  

## 2020-06-11 NOTE — ED Provider Notes (Signed)
[facility] DEPT Provider Note   CSN: [id number] Arrival date & time: 06/11/20  1043     History Chief Complaint  Patient presents with  . Sore Throat  . Abdominal Pain    Dominique Hancock is a 39 y.o. female.  HPI 39 year old female presents with upper abdominal/lower chest pain and sore throat.  Started having a scratchy throat yesterday as well as the discomfort in her abdomen.  Continues today.  Any type of movement seems to make it worse including walking.  There is no cough or shortness of breath and her chest does not really hurt.  No vomiting, diarrhea or lower abdominal pain.  She took a Vicodin this morning which did help though there is still some residual discomfort.  No burning sensation in her chest. Pain is pleuritic.   Past Medical History:  Diagnosis Date  . Anemia   . Menorrhagia   . Submucous uterine fibroid   . Wears glasses     Patient Active Problem List   Diagnosis Date Noted  . Uterine leiomyoma 04/25/2020  . Menorrhagia 04/25/2020  . Chronic pain of right knee 12/28/2019  . Fibroids 03/09/2019  . 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 DILATATION & CURETTAGE/HYSTEROSCOPY WITH MYOSURE N/A 04/17/2019   Procedure: DILATATION & CURETTAGE/HYSTEROSCOPY WITH MYOSURE XL;  Surgeon: Servando Salina, MD;  Location: [facility];  Service: Gynecology;  Laterality: N/A;  Request 1 hr.  Marland Kitchen DILATION AND CURETTAGE OF UTERUS  05-25-2007  @[facility]   . IR ANGIOGRAM PELVIS SELECTIVE OR SUPRASELECTIVE  04/25/2020  . IR ANGIOGRAM SELECTIVE EACH ADDITIONAL VESSEL  04/25/2020  . IR ANGIOGRAM SELECTIVE EACH ADDITIONAL VESSEL  04/25/2020  . IR EMBO TUMOR ORGAN ISCHEMIA INFARCT INC GUIDE ROADMAPPING  04/25/2020  . IR RADIOLOGIST EVAL & MGMT  01/02/2020  . IR RADIOLOGIST EVAL & MGMT  05/14/2020  . IR US GUIDE VASC ACCESS RIGHT  04/25/2020     OB History   No obstetric  history on file.     Family History  Problem Relation Age of Onset  . Cancer Mother   . High Cholesterol Mother   . Diabetes Mother     Social History   Tobacco Use  . Smoking status: Never Smoker  . Smokeless tobacco: Never Used  Vaping Use  . Vaping Use: Never assessed  Substance Use Topics  . Alcohol use: Yes    Comment: occasional  . Drug use: Never    Home Medications Prior to Admission medications   Medication Sig Start Date End Date Taking? Authorizing Provider  ferrous sulfate 325 (65 FE) MG tablet Take 325 mg by mouth daily with breakfast.   Yes [provider]  fluocinonide ointment (LIDEX) 4.09 % Apply 1 application topically 3 (three) times a week.  05/27/20  Yes [provider]  HYDROcodone-acetaminophen (NORCO/VICODIN) 5-325 MG tablet Take 1-2 tablets by mouth every 6 (six) hours as needed for moderate pain. 04/26/20  Yes Allred, Darrell K, PA-C  MELATONIN PO Take 1 tablet by mouth at bedtime as needed (sleep).   Yes [provider]  promethazine (PHENERGAN) 25 MG tablet Take 1 tablet (25 mg total) by mouth every 8 (eight) hours as needed for nausea. 04/26/20  Yes Allred, Darrell K, PA-C  SRONYX 0.1-20 MG-MCG tablet Take 1 tablet by mouth daily. 11/20/19  Yes [provider]  VITAMIN D PO Take 1 capsule by mouth daily.   Yes  [provider]  zinc gluconate 50 MG tablet Take 50 mg by mouth daily.   Yes [provider]  naproxen (NAPROSYN) 375 MG tablet Take 1 tablet (375 mg total) by mouth 2 (two) times daily. 06/11/20   Sherwood Gambler, MD    Allergies    Patient has no known allergies.  Review of Systems   Review of Systems  Constitutional: Negative for fever.  HENT: Positive for sore throat.   Respiratory: Negative for shortness of breath.   Cardiovascular: Positive for chest pain.  Gastrointestinal: Positive for abdominal pain. Negative for diarrhea and vomiting.  All other systems reviewed and are  negative.   Physical Exam Updated Vital Signs BP 112/74   Pulse 70   Temp 98.8 F (37.1 C) (Oral)   Resp 18   Ht 5\' 5"  (1.651 m)   Wt 72.1 kg   LMP 05/24/2020 (Approximate)   SpO2 99%   BMI 26.46 kg/m   Physical Exam Vitals and nursing note reviewed.  Constitutional:      General: She is not in acute distress.    Appearance: She is well-developed. She is not ill-appearing or diaphoretic.  HENT:     Head: Normocephalic and atraumatic.     Right Ear: External ear normal.     Left Ear: External ear normal.     Nose: Nose normal.     Mouth/Throat:     Pharynx: Oropharynx is clear. Uvula midline. No pharyngeal swelling, oropharyngeal exudate, posterior oropharyngeal erythema or uvula swelling.     Tonsils: No tonsillar exudate or tonsillar abscesses.  Eyes:     General:        Right eye: No discharge.        Left eye: No discharge.  Cardiovascular:     Rate and Rhythm: Normal rate and regular rhythm.     Heart sounds: Normal heart sounds.  Pulmonary:     Effort: Pulmonary effort is normal.     Breath sounds: Normal breath sounds.  Chest:     Chest wall: No tenderness.  Abdominal:     General: There is no distension.     Palpations: Abdomen is soft.     Tenderness: There is no abdominal tenderness.  Skin:    General: Skin is warm and dry.  Neurological:     Mental Status: She is alert.  Psychiatric:        Mood and Affect: Mood is not anxious.     ED Results / Procedures / Treatments   Labs (all labs ordered are listed, but only abnormal results are displayed) Labs Reviewed  CBC - Abnormal; Notable for the following components:      Result Value   RDW 17.0 (*)    All other components within normal limits  RESPIRATORY PANEL BY RT PCR (FLU A&B, COVID)  LIPASE, BLOOD  COMPREHENSIVE METABOLIC PANEL  HCG, QUANTITATIVE, PREGNANCY  D-DIMER, QUANTITATIVE (NOT AT [facility])  URINALYSIS, ROUTINE W REFLEX MICROSCOPIC  TROPONIN I (HIGH SENSITIVITY)    EKG EKG  Interpretation  Date/Time:  Tuesday June 11 2020 12:36:14 EDT Ventricular Rate:  64 PR Interval:    QRS Duration: 82 QT Interval:  411 QTC Calculation: 424 R Axis:   67 Text Interpretation: Sinus rhythm no acute ST/T changes No old tracing to compare Confirmed by Sherwood Gambler [phone]) on 06/11/2020 1:07:31 PM   Radiology DG Chest 2 View  Result Date: 06/11/2020 CLINICAL DATA:  Chest pain EXAM: CHEST - 2 VIEW COMPARISON:  None. FINDINGS:  Lungs are clear. Heart size and pulmonary vascularity are normal. No adenopathy. No pneumothorax. No bone lesions. IMPRESSION: Lungs clear.  Cardiac silhouette normal. Electronically Signed   By: Lowella Grip III M.D.   On: 06/11/2020 12:07    Procedures Procedures (including critical care time)  Medications Ordered in ED Medications  ketorolac (TORADOL) 15 MG/ML injection 15 mg (15 mg Intravenous Given 06/11/20 1201)    ED Course  I have reviewed the triage vital signs and the nursing notes.  Pertinent labs & imaging results that were available during my care of the patient were reviewed by me and considered in my medical decision making (see chart for details).    MDM Rules/Calculators/A&P                          Patient's lab work has been reviewed and is unremarkable.  Her Covid testing is negative.  Abdominal labs are unremarkable and given no abdominal tenderness I think acute intra-abdominal emergency is very unlikely.  Do not think acute imaging is needed.  Her pain is more in her inferior chest though with a negative troponin, D-dimer, and low suspicion for ACS and PE and dissection I do not think further work-up is needed.  Probably muscular in etiology.  Will discharge home with NSAIDs and return precautions. Final Clinical Impression(s) / ED Diagnoses Final diagnoses:  Nonspecific chest pain    Rx / DC Orders ED Discharge Orders         Ordered    naproxen (NAPROSYN) 375 MG tablet  2 times daily        06/11/20 1317            Sherwood Gambler, MD 06/11/20 [phone]

## 2020-06-11 NOTE — Discharge Instructions (Addendum)
If you develop recurrent, continued, or worsening chest pain, shortness of breath, fever, vomiting, abdominal or back pain, or any other new/concerning symptoms then return to the ER for evaluation.   You are being prescribed naproxen, do not take Ibuprofen/Advil/Aleve/Motrin/Goody Powders/Naproxen/BC powders/Meloxicam/Diclofenac/Indomethacin and other Nonsteroidal anti-inflammatory medications. You may still take tylenol

## 2020-06-11 NOTE — ED Triage Notes (Addendum)
Patient c/o sore throat that started this AM and upper abdominal pain since yesterday. patien tstates she took a Vicodin at 0830 today.

## 2020-06-26 ENCOUNTER — Encounter: Payer: Self-pay | Admitting: Family Medicine

## 2020-06-26 ENCOUNTER — Ambulatory Visit (INDEPENDENT_AMBULATORY_CARE_PROVIDER_SITE_OTHER): Payer: Managed Care, Other (non HMO) | Admitting: Family Medicine

## 2020-06-26 ENCOUNTER — Other Ambulatory Visit: Payer: Self-pay

## 2020-06-26 VITALS — BP 108/76 | HR 66 | Temp 98.1°F | Wt 161.6 lb

## 2020-06-26 DIAGNOSIS — K5903 Drug induced constipation: Secondary | ICD-10-CM

## 2020-06-26 DIAGNOSIS — R1010 Upper abdominal pain, unspecified: Secondary | ICD-10-CM | POA: Diagnosis not present

## 2020-06-26 DIAGNOSIS — D500 Iron deficiency anemia secondary to blood loss (chronic): Secondary | ICD-10-CM

## 2020-06-26 DIAGNOSIS — R0982 Postnasal drip: Secondary | ICD-10-CM

## 2020-06-26 MED ORDER — FLUTICASONE PROPIONATE 50 MCG/ACT NA SUSP: 1.0000 | Freq: Every day | NASAL | 0 refills | Status: DC

## 2020-06-26 MED ORDER — NAPROXEN 375 MG PO TABS: 375.0000 mg | ORAL_TABLET | Freq: Two times a day (BID) | ORAL | 0 refills | Status: DC

## 2020-06-26 MED ORDER — POLYETHYLENE GLYCOL 3350 17 GM/SCOOP PO POWD: 17.0000 g | Freq: Two times a day (BID) | ORAL | 1 refills | Status: DC | PRN

## 2020-06-26 NOTE — Patient Instructions (Signed)
Abdominal Pain, Adult Pain in the abdomen (abdominal pain) can be caused by many things. Often, abdominal pain is not serious and it gets better with no treatment or by being treated at home. However, sometimes abdominal pain is serious. Your health care provider will ask questions about your medical history and do a physical exam to try to determine the cause of your abdominal pain. Follow these instructions at home:  Medicines  Take over-the-counter and prescription medicines only as told by your health care provider.  Do not take a laxative unless told by your health care provider. General instructions  Watch your condition for any changes.  Drink enough fluid to keep your urine pale yellow.  Keep all follow-up visits as told by your health care provider. This is important. Contact a health care provider if:  Your abdominal pain changes or gets worse.  You are not hungry or you lose weight without trying.  You are constipated or have diarrhea for more than 2-3 days.  You have pain when you urinate or have a bowel movement.  Your abdominal pain wakes you up at night.  Your pain gets worse with meals, after eating, or with certain foods.  You are vomiting and cannot keep anything down.  You have a fever.  You have blood in your urine. Get help right away if:  Your pain does not go away as soon as your health care provider told you to expect.  You cannot stop vomiting.  Your pain is only in areas of the abdomen, such as the right side or the left lower portion of the abdomen. Pain on the right side could be caused by appendicitis.  You have bloody or black stools, or stools that look like tar.  You have severe pain, cramping, or bloating in your abdomen.  You have signs of dehydration, such as: ? Dark urine, very little urine, or no urine. ? Cracked lips. ? Dry mouth. ? Sunken eyes. ? Sleepiness. ? Weakness.  You have trouble breathing or chest  pain. Summary  Often, abdominal pain is not serious and it gets better with no treatment or by being treated at home. However, sometimes abdominal pain is serious.  Watch your condition for any changes.  Take over-the-counter and prescription medicines only as told by your health care provider.  Contact a health care provider if your abdominal pain changes or gets worse.  Get help right away if you have severe pain, cramping, or bloating in your abdomen. This information is not intended to replace advice given to you by your health care provider. Make sure you discuss any questions you have with your health care provider. Document Revised: 12/12/2018 Document Reviewed: 12/12/2018 Elsevier Patient Education  2020 [place].  Chronic Constipation  Chronic constipation is a condition in which a person has three or fewer bowel movements a week, for three months or longer. This condition is especially common in older adults. The two main kinds of chronic constipation are secondary constipation and functional constipation. Secondary constipation results from another condition or a treatment. Functional constipation, also called primary or idiopathic constipation, is divided into three types:  Normal transit constipation. In this type, movement of stool through the colon (stool transit) occurs normally.  Slow transit constipation. In this type, stool moves slowly through the colon.  Outlet constipation or pelvic floor dysfunction. In this type, the nerves and muscles that empty the rectum do not work normally. What are the causes? Causes of secondary constipation may  include:  Failing to drink enough fluid, eat enough food or fiber, or get physically active.  Pregnancy.  A tear in the anus (anal fissure).  Blockage in the bowel (bowel obstruction).  Narrowing of the bowel (bowel stricture).  Having a long-term medical condition, such as: ? Diabetes. ? Hypothyroidism. ? Multiple  sclerosis. ? Parkinson disease. ? Stroke. ? Spinal cord injury. ? Dementia. ? Colon cancer. ? Inflammatory bowel disease (IBD). ? Iron-deficiency anemia. ? Outward collapse of the rectum (rectal prolapse). ? Hemorrhoids.  Taking certain medicines, including: ? Narcotics. These are a certain type of prescription pain medicine. ? Antacids. ? Iron supplements. ? Water pills (diuretics). ? Certain blood pressure medicines. ? Anti-seizure medicines. ? Antidepressants. ? Medicines for Parkinson disease. The cause of functional constipation is not known, but some conditions are associated with it. These conditions include:  Stress.  Problems in the nerves and muscles that control stool transit.  Weak or impaired pelvic floor muscles. What increases the risk? You may be at higher risk for chronic constipation if you:  Are older than age 41.  Are female.  Live in a long-term care facility.  Do not get much exercise or physical activity (have a sedentary lifestyle).  Do not drink enough fluids.  Do not eat enough food, especially fiber.  Have a long-term disease.  Have a mental health disorder or eating disorder.  Take many medicines. What are the signs or symptoms? The main symptom of chronic constipation is having three or fewer bowel movements a week for several weeks. Other signs and symptoms may vary from person to person. These include:  Pushing hard (straining) to pass stool.  Painful bowel movements.  Having hard or lumpy stools.  Having lower belly discomfort, such as cramps or bloating.  Being unable to have a bowel movement when you feel the urge.  Feeling like you still need to pass stool after a bowel movement.  Feeling that you have something in your rectum that is blocking or preventing bowel movements.  Seeing blood on the toilet paper or in your stool.  Worsening confusion (in older adults). How is this diagnosed? This condition may be  diagnosed based on:  Symptoms and medical history. You will be asked about your symptoms, lifestyle, diet, and any medicines that you are taking.  Physical exam. ? Your belly (abdomen) will be examined. ? A digital rectal exam may be done. For this exam, a health care provider places a lubricated, gloved finger into the rectum.  Other tests to check for any underlying causes of your constipation. These may be ordered if you have bleeding in your rectum, weight loss, or a family history of colon cancer. In these cases, you may have: ? Imaging studies of the colon. These may include X-ray, ultrasound, or CT scan. ? Blood tests. ? A procedure to examine the inside of your colon (colonoscopy). ? More specialized tests to check:  Whether your anal sphincter works well. This is a ring-shaped muscle that controls the closing of the anus.  How well food moves through your colon. ? Tests to measure the nerve signal in your pelvic floor muscles (electromyography). How is this treated? Treatment for chronic constipation depends on the cause. Most often, treatment starts with:  Being more active and getting regular exercise.  Drinking more fluids.  Adding fiber to your diet. Sources of fiber include fruits, vegetables, whole grains, and fiber supplements.  Using medicines such as stool softeners or medicines that increase contractions  in your digestive system (pro-motility agents).  Training your pelvic muscles with biofeedback.  Surgery, if there is obstruction. Treatment for secondary chronic constipation depends on the underlying condition. You may need to:  Stop or change some medicines if they cause constipation.  Use a fiber supplement (bulk laxative) or stool softener.  Use prescription laxative. This works by [employer] into your colon (osmotic laxative). You may also need to see a specialist who treats conditions of the digestive system (gastroenterologist). Follow these  instructions at home:   Take over-the-counter and prescription medicines only as told by your health care provider.  If you are taking a laxative, take it as told by your health care provider.  Eat a balanced diet that includes enough fiber. Ask your health care provider to recommend a diet that is right for you.  Drink clear fluids, especially water. Avoid drinking alcohol, caffeine, and soda.  Drink enough fluid to keep your urine pale yellow.  Get some physical activity every day. Ask your health care provider what physical activities are safe for you.  Get colon cancer screenings as told by your health care provider.  Keep all follow-up visits as told by your health care provider. This is important. Contact a health care provider if:  You are having three or fewer bowel movements a week.  Your stools are hard or lumpy.  You notice blood on the toilet paper or in your stool after you have a bowel movement.  You have unexplained weight loss.  You have rectum (rectal) pain.  You have stool leakage.  You experience nausea or vomiting. Get help right away if:  You have rectal bleeding or you pass blood clots.  You have severe rectal pain.  You have body tissue that pushes out (protrudes) from your anus.  You have severe pain or bloating (distension) in your abdomen.  You have vomiting that you cannot control. Summary  Chronic constipation is a condition in which a person has three or fewer bowel movements a week, for three months or longer.  You may have a higher risk for this condition if you are an older adult, or if you do not drink enough water or get enough physical activity (are sedentary).  Treatment for this condition depends on the cause. Most treatments for chronic constipation include adding fiber to your diet, drinking more fluids, and getting more physical activity. You may also need to treat any underlying medical conditions or stop or change certain  medicines if they cause constipation.  If lifestyle changes do not relieve constipation, your health care provider may recommend taking a laxative. This information is not intended to replace advice given to you by your health care provider. Make sure you discuss any questions you have with your health care provider. Document Revised: 07/16/2017 Document Reviewed: 04/20/2017 Elsevier Patient Education  [employer].                                                                                               Postnasal Drip Postnasal drip is the feeling of mucus going down the back of your  throat. Mucus is a slimy substance that moistens and cleans your nose and throat, as well as the air pockets in face bones near your forehead and cheeks (sinuses). Small amounts of mucus pass from your nose and sinuses down the back of your throat all the time. This is normal. When you produce too much mucus or the mucus gets too thick, you can feel it. Some common causes of postnasal drip include:  Having more mucus because of: ? A cold or the flu. ? Allergies. ? Cold air. ? Certain medicines.  Having more mucus that is thicker because of: ? A sinus or nasal infection. ? Dry air. ? A food allergy. Follow these instructions at home: Relieving discomfort   Gargle with a salt-water mixture 3-4 times a day or as needed. To make a salt-water mixture, completely dissolve -1 tsp of salt in 1 cup of warm water.  If the air in your home is dry, use a humidifier to add moisture to the air.  Use a saline spray or container (neti pot) to flush out the nose (nasal irrigation). These methods can help clear away mucus and keep the nasal passages moist. General instructions  Take over-the-counter and prescription medicines only as told by your health care provider.  Follow instructions from your health care  provider about eating or drinking restrictions. You may need to avoid caffeine.  Avoid things that you know you are allergic to (allergens), like dust, mold, pollen, pets, or certain foods.  Drink enough fluid to keep your urine pale yellow.  Keep all follow-up visits as told by your health care provider. This is important. Contact a health care provider if:  You have a fever.  You have a sore throat.  You have difficulty swallowing.  You have headache.  You have sinus pain.  You have a cough that does not go away.  The mucus from your nose becomes thick and is green or yellow in color.  You have cold or flu symptoms that last more than 10 days. Summary  Postnasal drip is the feeling of mucus going down the back of your throat.  If your health care provider approves, use nasal irrigation or a nasal spray 2?4 times a day.  Avoid things that you know you are allergic to (allergens), like dust, mold, pollen, pets, or certain foods. This information is not intended to replace advice given to you by your health care provider. Make sure you discuss any questions you have with your health care provider. Document Revised: 11/25/2018 Document Reviewed: 11/16/2016 Elsevier Patient Education  [employer].

## 2020-06-26 NOTE — Progress Notes (Signed)
Subjective:    Patient ID: Dominique Hancock, female    DOB: July 08, 1981, 39 y.o.   MRN: [id number]  No chief complaint on file.   HPI Patient was seen today for ED f/u.  Pt seen in ED on 10/26 with upper abdominal pain and sore throat.  Labs and imaging were negative.  Upper abdominal pain thought 2/2 possible nonspecific muscle strain.  Naproxen helps.  Wearing a sports bra irritates area.  Pt endorses soreness/sharp sensation with movement.  Pt does note she started working out again.  In PT for h/o R knee pain. Pt notes some constipation with taking iron supplements.  LMP ended on Sunday.   Patient denies GERD symptoms.  Notes increased postnasal drainage at night.  Sore throat has since resolved.  Past Medical History:  Diagnosis Date  . Anemia   . Menorrhagia   . Submucous uterine fibroid   . Wears glasses     No Known Allergies  ROS General: Denies fever, chills, night sweats, changes in weight, changes in appetite HEENT: Denies headaches, ear pain, changes in vision, rhinorrhea, sore throat  + postnasal drainage CV: Denies CP, palpitations, SOB, orthopnea Pulm: Denies SOB, cough, wheezing GI: Denies nausea, vomiting, diarrhea + constipation, abdominal pain GU: Denies dysuria, hematuria, frequency, vaginal discharge Msk: Denies muscle cramps, joint pains Neuro: Denies weakness, numbness, tingling Skin: Denies rashes, bruising Psych: Denies depression, anxiety, hallucinations      Objective:    Blood pressure 108/76, pulse 66, temperature 98.1 F (36.7 C), temperature source Oral, weight 161 lb 9.6 oz (73.3 kg), SpO2 99 %.  Gen. Pleasant, well-nourished, in no distress, normal affect   HEENT: [place]/AT, face symmetric, conjunctiva clear, no scleral icterus, PERRLA, EOMI, nares patent without drainage Lungs: no accessory muscle use, CTAB, no wheezes or rales Cardiovascular: RRR, no m/r/g, no peripheral edema Abdomen: BS present, soft, NT/ND, no  hepatosplenomegaly. Musculoskeletal: No deformities, no cyanosis or clubbing, normal tone Neuro:  A&Ox3, CN II-XII intact, normal gait Skin:  Warm, no lesions/ rash   Wt Readings from Last 3 Encounters:  06/26/20 161 lb 9.6 oz (73.3 kg)  06/11/20 159 lb (72.1 kg)  04/25/20 159 lb 3.2 oz (72.2 kg)    Lab Results  Component Value Date   WBC 9.5 06/11/2020   HGB 12.1 06/11/2020   HCT 37.0 06/11/2020   PLT 372 06/11/2020   GLUCOSE 98 06/11/2020   CHOL 231 (H) 02/09/2020   TRIG 102.0 02/09/2020   HDL 55.10 02/09/2020   LDLCALC 155 (H) 02/09/2020   ALT 14 06/11/2020   AST 16 06/11/2020   NA 140 06/11/2020   K 3.8 06/11/2020   CL 106 06/11/2020   CREATININE 0.64 06/11/2020   BUN 8 06/11/2020   CO2 24 06/11/2020   TSH 1.83 02/09/2020   INR 0.9 04/25/2020   HGBA1C 5.8 02/09/2020    Assessment/Plan:  Post-nasal drainage - Plan: fluticasone (FLONASE) 50 MCG/ACT nasal spray  Pain of upper abdomen  -Likely musculoskeletal as work-up in ED was negative -Discussed supportive care -We will continue to monitor and NSAIDs as needed - Plan: naproxen (NAPROSYN) 375 MG tablet  Drug-induced constipation -Due 2/2 ferrous sulfate 325 mg daily for history of anemia 2/2 fibroids/heavy bleeding - Plan: polyethylene glycol powder (GLYCOLAX/MIRALAX) 17 GM/SCOOP powder  Iron deficiency anemia due to chronic blood loss -Continue iron 325 mg daily -Continue follow-up with OB/GYN for fibroid causing heavy bleeding. - Plan: polyethylene glycol powder (GLYCOLAX/MIRALAX) 17 GM/SCOOP powder  F/u as needed  [employer]  Volanda Napoleon, MD

## 2020-10-16 ENCOUNTER — Other Ambulatory Visit: Payer: Self-pay | Admitting: Diagnostic Radiology

## 2020-10-16 DIAGNOSIS — D250 Submucous leiomyoma of uterus: Secondary | ICD-10-CM

## 2020-10-25 ENCOUNTER — Other Ambulatory Visit: Payer: Self-pay | Admitting: Obstetrics and Gynecology

## 2020-10-25 DIAGNOSIS — O3670X0 Maternal care for viable fetus in abdominal pregnancy, unspecified trimester, not applicable or unspecified: Secondary | ICD-10-CM

## 2020-10-28 ENCOUNTER — Ambulatory Visit (HOSPITAL_COMMUNITY): Payer: Managed Care, Other (non HMO)

## 2020-10-29 ENCOUNTER — Ambulatory Visit
Admission: RE | Admit: 2020-10-29 | Discharge: 2020-10-29 | Disposition: A | Discharge status: Home / Self Care | Payer: Managed Care, Other (non HMO) | Source: Ambulatory Visit | Attending: Diagnostic Radiology | Admitting: Diagnostic Radiology

## 2020-10-29 ENCOUNTER — Encounter: Payer: Self-pay | Admitting: *Deleted

## 2020-10-29 ENCOUNTER — Other Ambulatory Visit: Payer: Self-pay

## 2020-10-29 DIAGNOSIS — D250 Submucous leiomyoma of uterus: Secondary | ICD-10-CM

## 2020-10-29 HISTORY — PX: IR RADIOLOGIST EVAL & MGMT: IMG5224

## 2020-10-29 NOTE — Progress Notes (Signed)
Chief Complaint: Patient was consulted remotely today (TeleHealth) for follow-up uterine artery embolization procedure.    Referring Physician(s): Garwin Brothers, Sheronette  History of Present Illness: Dominique Hancock is a 40 y.o. female with history of abnormal uterine bleeding, menorrhagia and hysteroscopic resection of a submucosal fibroid.  Patient underwent a successful uterine artery embolization procedure on 04/25/2020.  Patient reports markedly decreased menstrual bleeding following the embolization procedure.  She also notes decreased fullness in her abdomen.  Overall, she is very happy with the results of the uterine artery embolization procedure and says that she would recommend the procedure to others.  Unexpectedly, the patient found out that she was pregnant last week.  She was not trying to get pregnant and actually thought it would be hard to get pregnant following the uterine artery embolization. She has not talked to her OB/GYN since the results of the pregnancy test and is scheduled for an OB ultrasound and in 1 to 2 weeks.  Pelvic MRI following embolization was denied by the insurance company.  Past Medical History:  Diagnosis Date  . Anemia   . Menorrhagia   . Submucous uterine fibroid   . Wears glasses     Past Surgical History:  Procedure Laterality Date  . DILATATION & CURETTAGE/HYSTEROSCOPY WITH MYOSURE N/A 04/17/2019   Procedure: DILATATION & CURETTAGE/HYSTEROSCOPY WITH MYOSURE XL;  Surgeon: Servando Salina, MD;  Location: [facility];  Service: Gynecology;  Laterality: N/A;  Request 1 hr.  Marland Kitchen DILATION AND CURETTAGE OF UTERUS  05-25-2007  @[facility]   . IR ANGIOGRAM PELVIS SELECTIVE OR SUPRASELECTIVE  04/25/2020  . IR ANGIOGRAM SELECTIVE EACH ADDITIONAL VESSEL  04/25/2020  . IR ANGIOGRAM SELECTIVE EACH ADDITIONAL VESSEL  04/25/2020  . IR EMBO TUMOR ORGAN ISCHEMIA INFARCT INC GUIDE ROADMAPPING  04/25/2020  . IR RADIOLOGIST EVAL & MGMT  01/02/2020  . IR  RADIOLOGIST EVAL & MGMT  05/14/2020  . IR US GUIDE VASC ACCESS RIGHT  04/25/2020    Allergies: Patient has no known allergies.  Medications: Prior to Admission medications   Medication Sig Start Date End Date Taking? Authorizing Provider  ferrous sulfate 325 (65 FE) MG tablet Take 325 mg by mouth daily with breakfast.    [provider]  fluocinonide ointment (LIDEX) 9.32 % Apply 1 application topically 3 (three) times a week.  05/27/20   [provider]  fluticasone (FLONASE) 50 MCG/ACT nasal spray Place 1 spray into both nostrils daily. 06/26/20   Billie Ruddy, MD  HYDROcodone-acetaminophen (NORCO/VICODIN) 5-325 MG tablet Take 1-2 tablets by mouth every 6 (six) hours as needed for moderate pain. 04/26/20   Allred, Darrell K, PA-C  MELATONIN PO Take 1 tablet by mouth at bedtime as needed (sleep).    [provider]  naproxen (NAPROSYN) 375 MG tablet Take 1 tablet (375 mg total) by mouth 2 (two) times daily. 06/26/20   Billie Ruddy, MD  polyethylene glycol powder (GLYCOLAX/MIRALAX) 17 GM/SCOOP powder Take 17 g by mouth 2 (two) times daily as needed. 06/26/20   Billie Ruddy, MD  promethazine (PHENERGAN) 25 MG tablet Take 1 tablet (25 mg total) by mouth every 8 (eight) hours as needed for nausea. 04/26/20   Allred, Darrell K, PA-C  SRONYX 0.1-20 MG-MCG tablet Take 1 tablet by mouth daily. 11/20/19   [provider]  VITAMIN D PO Take 1 capsule by mouth daily.    [provider]  zinc gluconate 50 MG tablet Take 50 mg by mouth daily.  [provider]     Family History  Problem Relation Age of Onset  . Cancer Mother   . High Cholesterol Mother   . Diabetes Mother     Social History   Socioeconomic History  . Marital status: Single    Spouse name: Not on file  . Number of children: 1  . Years of education: Not on file  . Highest education level: Not on file  Occupational History  . Not on file  Tobacco Use  . Smoking  status: Never Smoker  . Smokeless tobacco: Never Used  Vaping Use  . Vaping Use: Not on file  Substance and Sexual Activity  . Alcohol use: Yes    Comment: occasional  . Drug use: Never  . Sexual activity: Not on file  Other Topics Concern  . Not on file  Social History Narrative  . Not on file   Social Determinants of Health   Financial Resource Strain: Not on file  Food Insecurity: Not on file  Transportation Needs: Not on file  Physical Activity: Not on file  Stress: Not on file  Social Connections: Not on file     Review of Systems  Gastrointestinal: Negative for abdominal distention.  Genitourinary: Negative for menstrual problem.      Physical Exam No direct physical exam was performed   Vital Signs: There were no vitals taken for this visit.  Imaging: No results found.  Labs:  CBC: Recent Labs    02/09/20 1008 03/08/20 1638 04/25/20 0806 06/11/20 1141  WBC 5.5 12.0* 5.7 9.5  HGB 8.7 Repeated and verified X2.* 8.1* 10.5* 12.1  HCT 27.5* 27.4* 33.1* 37.0  PLT 405.0* 386 473* 372    COAGS: Recent Labs    04/25/20 0806  INR 0.9    BMP: Recent Labs    02/09/20 1008 03/08/20 1638 04/25/20 0806 06/11/20 1141  NA 139 138 135 140  K 4.2 3.7 3.6 3.8  CL 105 104 106 106  CO2 25 25 21* 24  GLUCOSE 90 102* 94 98  BUN 8 7 11 8   CALCIUM 9.2 9.4 9.0 9.5  CREATININE 0.75 0.71 0.70 0.64  GFRNONAA  --  >60 >60 >60  GFRAA  --  >60 >60  --     LIVER FUNCTION TESTS: Recent Labs    03/08/20 1638 06/11/20 1141  BILITOT 0.5 0.5  AST 22 16  ALT 16 14  ALKPHOS 55 49  PROT 8.6* 7.9  ALBUMIN 4.1 3.8    TUMOR MARKERS: No results for input(s): AFPTM, CEA, CA199, CHROMGRNA in the last 8760 hours.  Assessment and Plan:  40 year old female with a history of of uterine fibroids and menorrhagia.  Patient had a large submucosal fibroid on the preprocedure MRI.  Patient has had excellent results following the embolization procedure with markedly  decreased menstrual bleeding and decreased abdominal distention.  Unexpectedly, the patient is pregnant.  We briefly discussed pregnancy following uterine artery embolization.  I did not have specific data on hand to explain the risks of pregnancy complications and miscarriages after uterine artery embolization but told the patient that I would be happy to get back to her with more details.  I did explain that patients can have successful safe pregnancies following uterine artery embolization but the embolization procedure and her age can put her at increased risk for certain complications.  She says that she will get more details from her gynecologist in the upcoming weeks.  No plans for additional follow-up with  interventional radiology.  Thank you for this interesting consult.  I greatly enjoyed meeting Shley Dolby and look forward to participating in their care.  A copy of this report was sent to the requesting provider on this date.  Electronically Signed: Burman Riis 10/29/2020, 10:14 AM   I spent a total of    10 Minutes in remote  clinical consultation, greater than 50% of which was counseling/coordinating care for uterine artery embolization.    Visit type: Audio only (telephone). Audio (no video) only due to Patient preference.. Alternative for in-person consultation at [facility], [address], [place], [place]. This visit type was conducted due to national recommendations for restrictions regarding the COVID-19 Pandemic (e.g. social distancing).  This format is felt to be most appropriate for this patient at this time.  All issues noted in this document were discussed and addressed.  Patient ID: Akari Defelice, female   DOB: [birth year], 40 y.o.   MRN: [id number]

## 2020-11-05 ENCOUNTER — Ambulatory Visit: Admission: RE | Admit: 2020-11-05 | Payer: Managed Care, Other (non HMO) | Source: Ambulatory Visit

## 2021-03-17 DIAGNOSIS — Z8616 Personal history of COVID-19: Secondary | ICD-10-CM

## 2021-03-17 HISTORY — DX: Personal history of COVID-19: Z86.16

## 2021-07-13 IMAGING — XA IR EMBO TUMOR ORGAN ISCHEMIA INFARCT INC GUIDE ROADMAPPING
10 of 12 series · 13 of 24 positions shown · IV contrast (IODINE)
Comparison: none

INDICATION: 39-year-old with uterine fibroids and menorrhagia.

[Series 1: care body 4 · 1 of 22 frames shown (1 of 9)]
[frame 4/22]
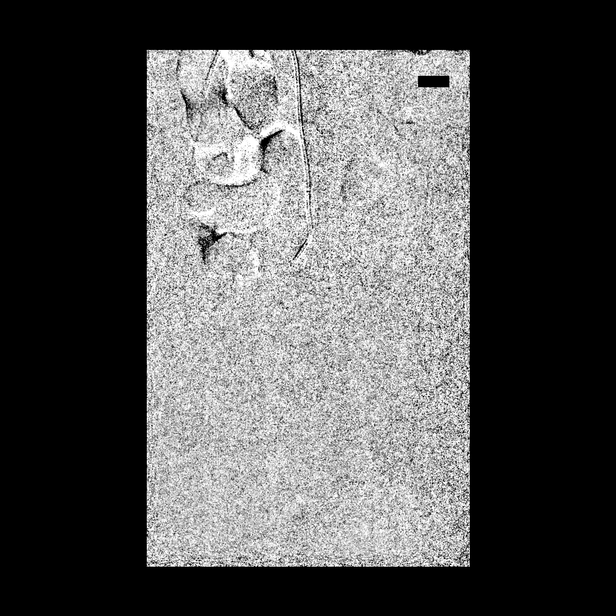

[Series 2: care body 4 · 1 of 23 frames shown (2 of 9)]
[frame 12/23]
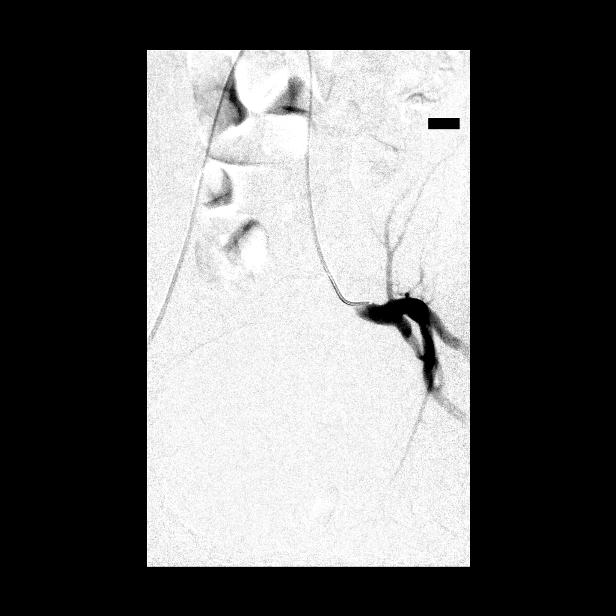

[Series 3: care body 4 · 1 of 18 frames shown (3 of 9)]
[frame 10/18]
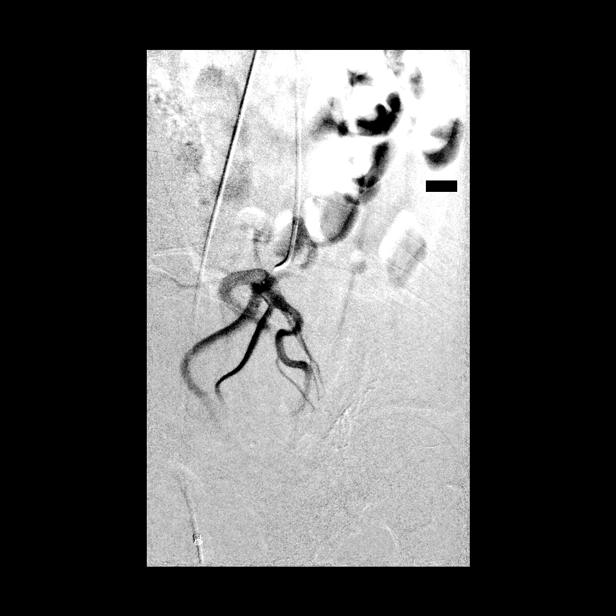

[Series 4: care body 4 · 1 of 26 frames shown (4 of 9)]
[frame 19/26]
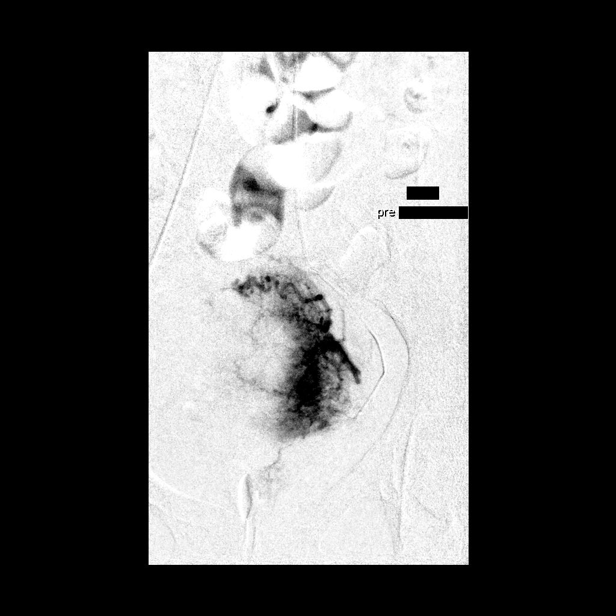

[Series 5: care body 4 · 1 of 23 frames shown (5 of 9)]
[frame 12/23]
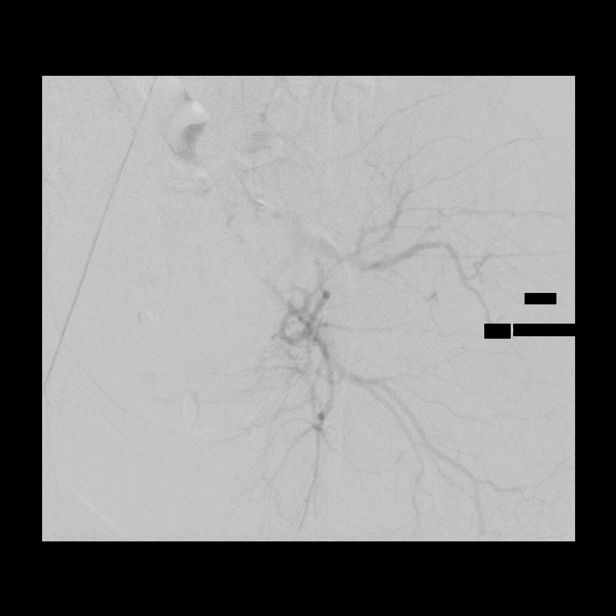

[Series 7: care body 4 · 1 of 19 frames shown (6 of 9)]
[frame 3/19]
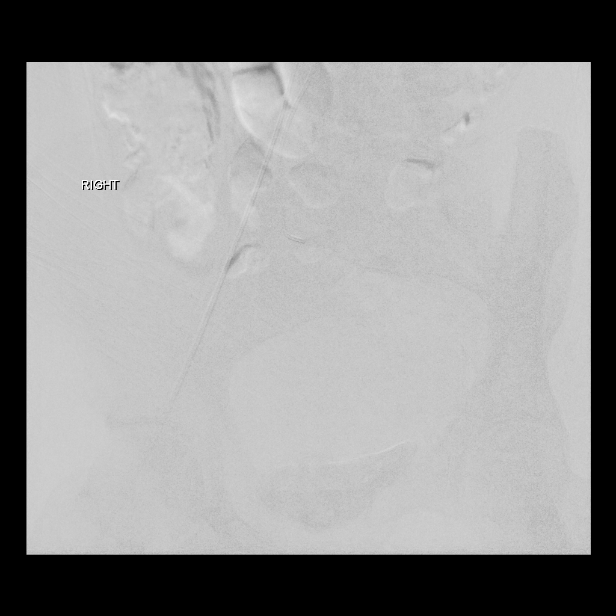

[Series 8: care body 4 · 2 of 29 frames shown (7 of 9)]
[frame 15/29]
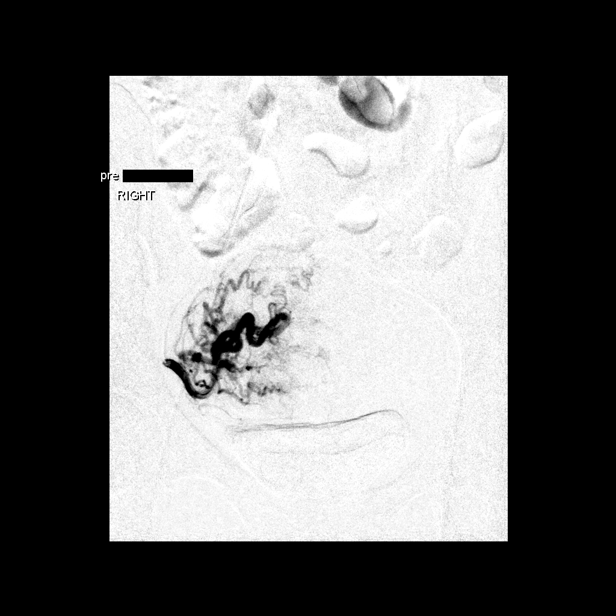
[frame 27/29]
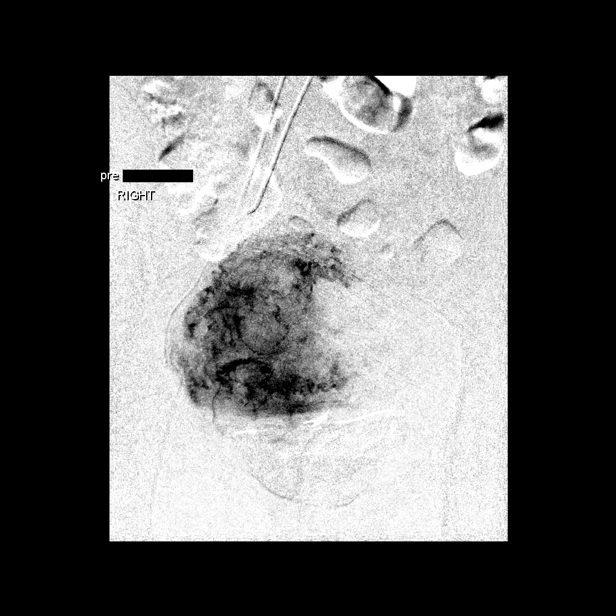

[Series 10: care body 4 · 1 of 32 frames shown (8 of 9)]
[frame 5/32]
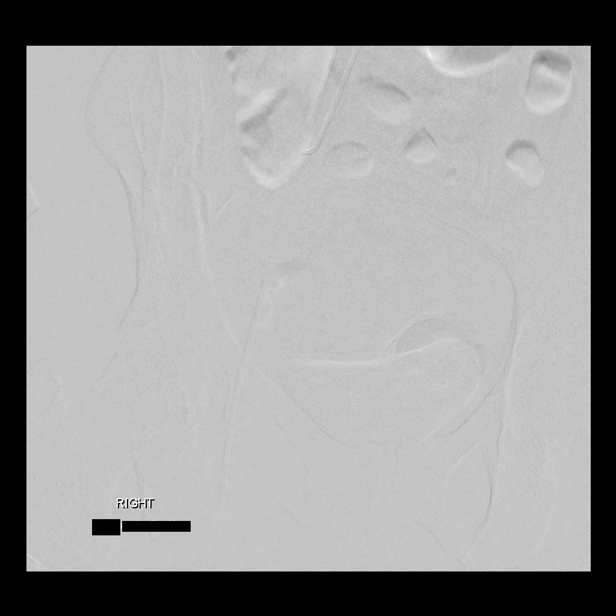

[Series 11: care body 4 · 1 of 16 frames shown (9 of 9)]
[frame 3/16]
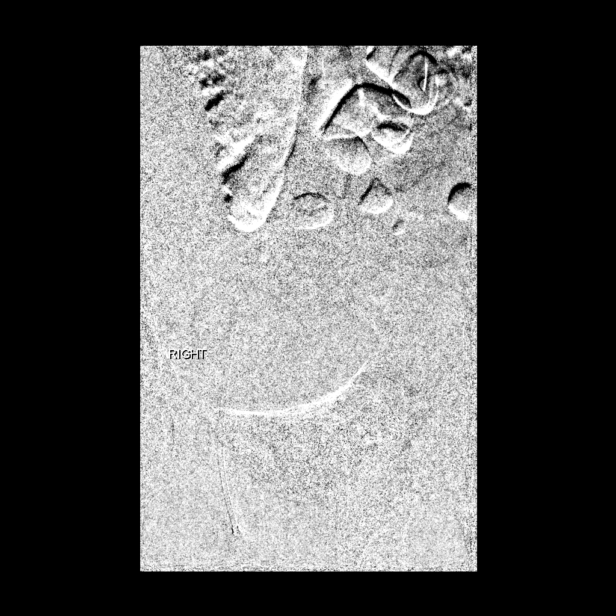

[Series 300: ld dsa body · 3 of 11 slices shown]
[im 2/11]
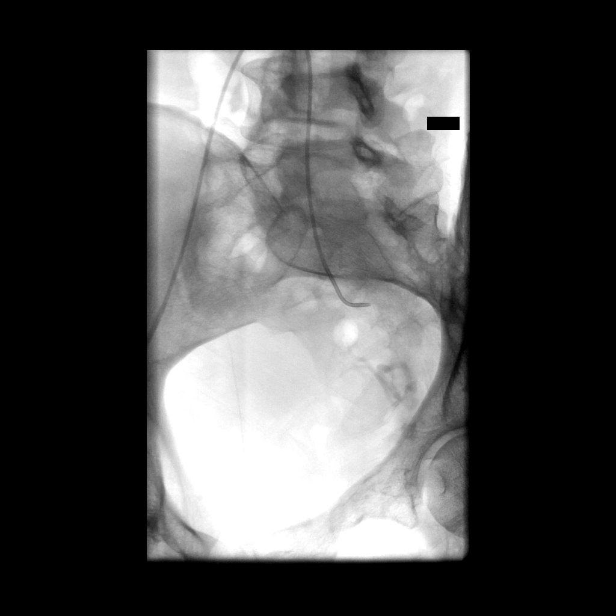
[im 6/11]
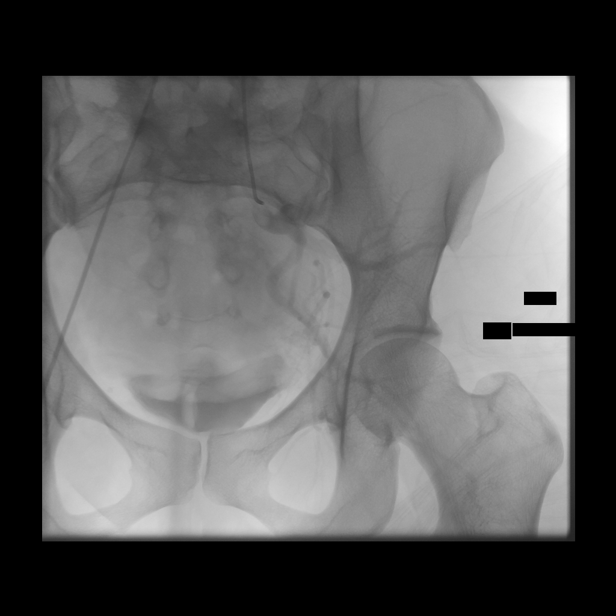
[im 11/11]
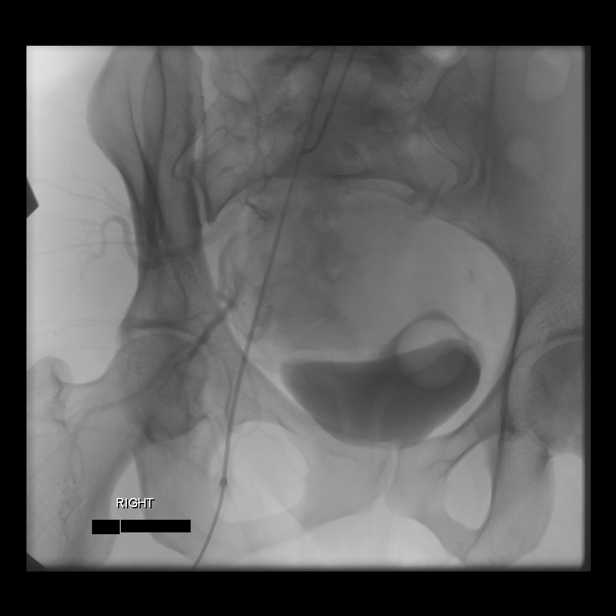

[13 of 24 positions shown; findings below may reference images not displayed]

EXAM:
1. PELVIC ANGIOGRAPHY INCLUDING BILATERAL INTERNAL ILIAC ARTERIES
AND BILATERAL UTERINE ARTERIES.
2. PARTICLE EMBOLIZATION OF BILATERAL UTERINE ARTERIES.
3. ULTRASOUND GUIDANCE FOR VASCULAR ACCESS

MEDICATIONS:
Ancef 2 g. The antibiotic was administered within 1 hour of the
procedure

ANESTHESIA/SEDATION:
Fentanyl 200 mcg IV; Versed 4.0 mg IV

Moderate Sedation Time:  67 minutes

The patient was continuously monitored during the procedure by the
interventional radiology nurse under my direct supervision.

CONTRAST:  80 mL Omnipaque 300

FLUOROSCOPY TIME:  Fluoroscopy Time: 19 minutes, 351 mGy

COMPLICATIONS:
None immediate.

PROCEDURE:
Informed consent was obtained from the patient following explanation
of the procedure, risks, benefits and alternatives. The patient
understands, agrees and consents for the procedure. All questions
were addressed. A time out was performed prior to the initiation of
the procedure.

The patient was placed supine on the interventional table. The
patient had palpable pedal pulses. Ultrasound confirmed a patent
right common femoral artery. Ultrasound image was saved for
documentation. The right groin was prepped and draped in a sterile
fashion. Maximal barrier sterile technique was utilized including
caps, mask, sterile gowns, sterile gloves, sterile drape, hand
hygiene and skin antiseptic. The skin was anesthetized 1% lidocaine.
Using ultrasound guidance, 21 gauge needle was directed in the right
common femoral artery and a micropuncture dilator set was placed.
The vascular access was upsized to a 5-French vascular sheath. A
Cobra catheter was used to cannulate the left common iliac artery
and the left internal iliac artery. A series of arteriograms were
performed to identify the left uterine artery orfice. A high-flow
Renegade microcatheter was advanced into the left uterine artery. 1
vial of Embospheres (500 - 700 micron) were injected through the
microcatheter with fluoroscopic guidance. There was near complete
stasis of the left uterine artery following administration of the
particles. Dayak Born loop was formed using the Cobra catheter and
the right internal iliac artery was cannulated. The right uterine
artery was identified with contrast angiograms. The microcatheter
was advanced into the right uterine artery and a series of
angiograms were performed. [DATE] vials of Embospheres (500-700 micron)
were injected through the microcatheter with fluoroscopic guidance.
There was near complete stasis of the right uterine artery at the
end of the procedure. The microcatheter was removed. The Cobra
catheter was straightened out over the aortic bifurcation and
removed over a wire. Angiogram was performed through the right groin
sheath. Right groin sheath was removed using Angio-Seal closure
device. Right groin hemostasis at the end of the procedure.
FINDINGS: Large uterine arteries bilaterally. Near complete stasis of the
uterine arteries at the end of the procedure. Fluoroscopic images
were obtained for documentation.
IMPRESSION: Successful uterine artery embolization procedure.

## 2021-08-28 ENCOUNTER — Other Ambulatory Visit: Payer: Self-pay | Admitting: Obstetrics and Gynecology

## 2021-09-17 DIAGNOSIS — N871 Moderate cervical dysplasia: Secondary | ICD-10-CM

## 2021-09-17 HISTORY — DX: Moderate cervical dysplasia: N87.1

## 2021-09-22 ENCOUNTER — Encounter (HOSPITAL_BASED_OUTPATIENT_CLINIC_OR_DEPARTMENT_OTHER): Payer: Self-pay | Admitting: Obstetrics and Gynecology

## 2021-09-23 ENCOUNTER — Encounter (HOSPITAL_BASED_OUTPATIENT_CLINIC_OR_DEPARTMENT_OTHER): Payer: Self-pay | Admitting: Obstetrics and Gynecology

## 2021-09-23 ENCOUNTER — Other Ambulatory Visit: Payer: Self-pay

## 2021-09-23 NOTE — Progress Notes (Signed)
Spoke w/ via phone for pre-op interview--- pt Lab needs dos----   cbc, urine preg            Lab results------ no COVID test -----patient states asymptomatic no test needed Arrive at ------- 0800 on 09-26-2021 NPO after MN NO Solid Food.  Clear liquids from MN until--- 0700 Med rec completed Medications to take morning of surgery ----- none Diabetic medication ----- n/a Patient instructed no nail polish to be worn day of surgery Patient instructed to bring photo id and insurance card day of surgery Patient aware to have Driver (ride ) / caregiver for 24 hours after surgery --  mother, Dominique Hancock Patient Special Instructions ----- n/a Pre-Op special Istructions ----- n/a Patient verbalized understanding of instructions that were given at this phone interview. Patient denies shortness of breath, chest pain, fever, cough at this phone interview.

## 2021-09-26 ENCOUNTER — Ambulatory Visit (HOSPITAL_BASED_OUTPATIENT_CLINIC_OR_DEPARTMENT_OTHER): Payer: Managed Care, Other (non HMO) | Admitting: Anesthesiology

## 2021-09-26 ENCOUNTER — Ambulatory Visit (HOSPITAL_BASED_OUTPATIENT_CLINIC_OR_DEPARTMENT_OTHER)
Admission: RE | Admit: 2021-09-26 | Discharge: 2021-09-26 | Disposition: A | Discharge status: Home / Self Care | Payer: Managed Care, Other (non HMO) | Attending: Obstetrics and Gynecology | Admitting: Obstetrics and Gynecology

## 2021-09-26 ENCOUNTER — Other Ambulatory Visit: Payer: Self-pay

## 2021-09-26 ENCOUNTER — Encounter (HOSPITAL_BASED_OUTPATIENT_CLINIC_OR_DEPARTMENT_OTHER): Payer: Self-pay | Admitting: Obstetrics and Gynecology

## 2021-09-26 ENCOUNTER — Encounter (HOSPITAL_BASED_OUTPATIENT_CLINIC_OR_DEPARTMENT_OTHER)
Admission: RE | Disposition: A | Discharge status: Home / Self Care | Payer: Self-pay | Source: Home / Self Care | Attending: Obstetrics and Gynecology

## 2021-09-26 DIAGNOSIS — D069 Carcinoma in situ of cervix, unspecified: Secondary | ICD-10-CM | POA: Diagnosis not present

## 2021-09-26 DIAGNOSIS — D060 Carcinoma in situ of endocervix: Secondary | ICD-10-CM | POA: Diagnosis present

## 2021-09-26 DIAGNOSIS — Z8616 Personal history of COVID-19: Secondary | ICD-10-CM | POA: Diagnosis not present

## 2021-09-26 HISTORY — PX: CERVICAL CONIZATION W/BX: SHX1330

## 2021-09-26 HISTORY — PX: COLPOSCOPY: SHX161

## 2021-09-26 HISTORY — PX: DILATION AND CURETTAGE OF UTERUS: SHX78

## 2021-09-26 LAB — CBC
HCT: 33.3 % — ABNORMAL LOW (ref 36.0–46.0)
Hemoglobin: 11.2 g/dL — ABNORMAL LOW (ref 12.0–15.0)
MCH: 29.3 pg (ref 26.0–34.0)
MCHC: 33.6 g/dL (ref 30.0–36.0)
MCV: 87.2 fL (ref 80.0–100.0)
Platelets: 323 10*3/uL (ref 150–400)
RBC: 3.82 MIL/uL — ABNORMAL LOW (ref 3.87–5.11)
RDW: 14.2 % (ref 11.5–15.5)
WBC: 5.8 10*3/uL (ref 4.0–10.5)
nRBC: 0 % (ref 0.0–0.2)

## 2021-09-26 LAB — POCT PREGNANCY, URINE: Preg Test, Ur: NEGATIVE

## 2021-09-26 SURGERY — COLPOSCOPY
Anesthesia: General | Site: Vagina

## 2021-09-26 MED ORDER — ACETAMINOPHEN 500 MG PO TABS
1000.0000 mg | ORAL_TABLET | Freq: Once | ORAL | Status: AC
  Administered 2021-09-26: 1000 mg via ORAL

## 2021-09-26 MED ORDER — FENTANYL CITRATE (PF) 100 MCG/2ML IJ SOLN: 25.0000 ug | INTRAMUSCULAR | Status: DC | PRN

## 2021-09-26 MED ORDER — MIDAZOLAM HCL 2 MG/2ML IJ SOLN
INTRAMUSCULAR | Status: AC
  Filled 2021-09-26: qty 2

## 2021-09-26 MED ORDER — MIDAZOLAM HCL 5 MG/5ML IJ SOLN
INTRAMUSCULAR | Status: DC | PRN
  Administered 2021-09-26: 2 mg via INTRAVENOUS

## 2021-09-26 MED ORDER — DEXAMETHASONE SODIUM PHOSPHATE 4 MG/ML IJ SOLN
INTRAMUSCULAR | Status: DC | PRN
  Administered 2021-09-26: 10 mg via INTRAVENOUS

## 2021-09-26 MED ORDER — OXYCODONE-ACETAMINOPHEN 5-325 MG PO TABS: 1.0000 | ORAL_TABLET | Freq: Four times a day (QID) | ORAL | 0 refills | Status: AC | PRN

## 2021-09-26 MED ORDER — ACETIC ACID 5 % SOLN
Status: DC | PRN
  Administered 2021-09-26: 1 via TOPICAL

## 2021-09-26 MED ORDER — LACTATED RINGERS IV SOLN: INTRAVENOUS | Status: DC

## 2021-09-26 MED ORDER — OXYCODONE HCL 5 MG PO TABS: 5.0000 mg | ORAL_TABLET | Freq: Once | ORAL | Status: DC | PRN

## 2021-09-26 MED ORDER — LIDOCAINE-EPINEPHRINE 1 %-1:100000 IJ SOLN
INTRAMUSCULAR | Status: DC | PRN
  Administered 2021-09-26: 20 mL

## 2021-09-26 MED ORDER — FENTANYL CITRATE (PF) 100 MCG/2ML IJ SOLN
INTRAMUSCULAR | Status: AC
  Filled 2021-09-26: qty 2

## 2021-09-26 MED ORDER — POVIDONE-IODINE 10 % EX SWAB: 2.0000 "application " | Freq: Once | CUTANEOUS | Status: DC

## 2021-09-26 MED ORDER — FERRIC SUBSULFATE 259 MG/GM EX SOLN
CUTANEOUS | Status: DC | PRN
  Administered 2021-09-26: 1

## 2021-09-26 MED ORDER — ONDANSETRON HCL 4 MG/2ML IJ SOLN
INTRAMUSCULAR | Status: AC
  Filled 2021-09-26: qty 2

## 2021-09-26 MED ORDER — PROPOFOL 10 MG/ML IV BOLUS
INTRAVENOUS | Status: DC | PRN
  Administered 2021-09-26: 200 mg via INTRAVENOUS

## 2021-09-26 MED ORDER — HEMOSTATIC AGENTS (NO CHARGE) OPTIME
TOPICAL | Status: DC | PRN
  Administered 2021-09-26: 1 via TOPICAL

## 2021-09-26 MED ORDER — CELECOXIB 200 MG PO CAPS
200.0000 mg | ORAL_CAPSULE | Freq: Once | ORAL | Status: AC
  Administered 2021-09-26: 200 mg via ORAL

## 2021-09-26 MED ORDER — 0.9 % SODIUM CHLORIDE (POUR BTL) OPTIME
TOPICAL | Status: DC | PRN
Start: 2021-09-26 — End: 2021-09-26
  Administered 2021-09-26: 500 mL

## 2021-09-26 MED ORDER — KETOROLAC TROMETHAMINE 30 MG/ML IJ SOLN
INTRAMUSCULAR | Status: AC
  Filled 2021-09-26: qty 1

## 2021-09-26 MED ORDER — IBUPROFEN 800 MG PO TABS: 800.0000 mg | ORAL_TABLET | Freq: Three times a day (TID) | ORAL | 11 refills | Status: AC | PRN

## 2021-09-26 MED ORDER — PROPOFOL 10 MG/ML IV BOLUS
INTRAVENOUS | Status: AC
  Filled 2021-09-26: qty 20

## 2021-09-26 MED ORDER — IODINE STRONG (LUGOLS) 5 % PO SOLN
ORAL | Status: DC | PRN
Start: 2021-09-26 — End: 2021-09-26
  Administered 2021-09-26: 0.2 mL

## 2021-09-26 MED ORDER — DEXAMETHASONE SODIUM PHOSPHATE 10 MG/ML IJ SOLN
INTRAMUSCULAR | Status: AC
  Filled 2021-09-26: qty 1

## 2021-09-26 MED ORDER — LIDOCAINE HCL (CARDIAC) PF 100 MG/5ML IV SOSY
PREFILLED_SYRINGE | INTRAVENOUS | Status: DC | PRN
  Administered 2021-09-26: 60 mg via INTRAVENOUS

## 2021-09-26 MED ORDER — PROMETHAZINE HCL 25 MG/ML IJ SOLN: 6.2500 mg | INTRAMUSCULAR | Status: DC | PRN

## 2021-09-26 MED ORDER — FENTANYL CITRATE (PF) 100 MCG/2ML IJ SOLN
INTRAMUSCULAR | Status: DC | PRN
  Administered 2021-09-26: 25 ug via INTRAVENOUS
  Administered 2021-09-26 (×3): 50 ug via INTRAVENOUS
  Administered 2021-09-26: 25 ug via INTRAVENOUS

## 2021-09-26 MED ORDER — KETOROLAC TROMETHAMINE 30 MG/ML IJ SOLN
INTRAMUSCULAR | Status: DC | PRN
Start: 2021-09-26 — End: 2021-09-26
  Administered 2021-09-26: 30 mg via INTRAVENOUS

## 2021-09-26 MED ORDER — LIDOCAINE HCL (PF) 2 % IJ SOLN
INTRAMUSCULAR | Status: AC
  Filled 2021-09-26: qty 5

## 2021-09-26 MED ORDER — ONDANSETRON HCL 4 MG/2ML IJ SOLN
INTRAMUSCULAR | Status: DC | PRN
  Administered 2021-09-26: 4 mg via INTRAVENOUS

## 2021-09-26 MED ORDER — OXYCODONE HCL 5 MG/5ML PO SOLN: 5.0000 mg | Freq: Once | ORAL | Status: DC | PRN

## 2021-09-26 SURGICAL SUPPLY — 26 items
BLADE SURG 11 STRL SS (BLADE) ×1 IMPLANT
CATH ROBINSON RED A/P 16FR (CATHETERS) ×2 IMPLANT
DRSG TELFA 3X8 NADH (GAUZE/BANDAGES/DRESSINGS) ×2 IMPLANT
ELECT LLETZ BALL 5MM DISP (ELECTRODE) ×1 IMPLANT
ELECT REM PT RETURN 9FT ADLT (ELECTROSURGICAL) ×2
ELECTRODE REM PT RTRN 9FT ADLT (ELECTROSURGICAL) IMPLANT
GAUZE 4X4 16PLY ~~LOC~~+RFID DBL (SPONGE) ×3 IMPLANT
GLOVE SURG LTX SZ6.5 (GLOVE) ×3 IMPLANT
GLOVE SURG UNDER POLY LF SZ7 (GLOVE) ×3 IMPLANT
GOWN STRL REUS W/TWL LRG LVL3 (GOWN DISPOSABLE) ×3 IMPLANT
HEMOSTAT SURGICEL 4X8 (HEMOSTASIS) ×1 IMPLANT
KIT TURNOVER CYSTO (KITS) ×3 IMPLANT
MANIFOLD NEPTUNE II (INSTRUMENTS) ×1 IMPLANT
NS IRRIG 500ML POUR BTL (IV SOLUTION) ×3 IMPLANT
PACK VAGINAL MINOR WOMEN LF (CUSTOM PROCEDURE TRAY) ×3 IMPLANT
PAD DRESSING TELFA 3X8 NADH (GAUZE/BANDAGES/DRESSINGS) ×2 IMPLANT
PAD OB MATERNITY 4.3X12.25 (PERSONAL CARE ITEMS) ×3 IMPLANT
PAD PREP 24X48 CUFFED NSTRL (MISCELLANEOUS) ×3 IMPLANT
SCOPETTES 8  STERILE (MISCELLANEOUS) ×2
SCOPETTES 8 STERILE (MISCELLANEOUS) IMPLANT
SUT SILK 0 SH 30 (SUTURE) ×1 IMPLANT
SUT VIC AB 0 CT1 27 (SUTURE) ×4
SUT VIC AB 0 CT1 27XBRD ANTBC (SUTURE) IMPLANT
SUT VIC AB 0 SH 27 (SUTURE) ×1 IMPLANT
TOWEL OR 17X26 10 PK STRL BLUE (TOWEL DISPOSABLE) ×3 IMPLANT
TUBE CONNECTING 12X1/4 (SUCTIONS) ×1 IMPLANT

## 2021-09-26 NOTE — Brief Op Note (Signed)
09/26/2021  11:28 AM  PATIENT:  Dominique Hancock  41 y.o. female  PRE-OPERATIVE DIAGNOSIS:  CIN 3, endocervical moderate dysplasia  POST-OPERATIVE DIAGNOSIS:  same  PROCEDURE:  Procedure(s): COLPOSCOPY (N/A) CONIZATION CERVIX WITH BIOPSY (N/A) DILATATION AND CURETTAGE (N/A)  SURGEON:  Surgeon(s) and Role:    * Servando Salina, MD - Primary  PHYSICIAN ASSISTANT:   ASSISTANTS: none   ANESTHESIA:   general  EBL:  5 mL   BLOOD ADMINISTERED:none  DRAINS: none   LOCAL MEDICATIONS USED:  OTHER intracervical lidocaine with epinephrine  SPECIMEN:  Source of Specimen:  cone, ECC  DISPOSITION OF SPECIMEN:  PATHOLOGY  COUNTS:  YES  TOURNIQUET:  * No tourniquets in log *  DICTATION: .Other Dictation: Dictation Number [id number]  PLAN OF CARE: Discharge to home after PACU  PATIENT DISPOSITION:  PACU - hemodynamically stable.   Delay start of Pharmacological VTE agent (>24hrs) due to surgical blood loss or risk of bleeding: no

## 2021-09-26 NOTE — Anesthesia Procedure Notes (Signed)
Procedure Name: LMA Insertion Date/Time: 09/26/2021 10:29 AM Performed by: Justice Rocher, CRNA Pre-anesthesia Checklist: Patient identified, Emergency Drugs available, Suction available, Patient being monitored and Timeout performed Patient Re-evaluated:Patient Re-evaluated prior to induction Oxygen Delivery Method: Circle system utilized Preoxygenation: Pre-oxygenation with 100% oxygen Induction Type: IV induction Ventilation: Mask ventilation without difficulty LMA: LMA inserted LMA Size: 4.0 Number of attempts: 1 Airway Equipment and Method: Bite block Placement Confirmation: positive ETCO2, breath sounds checked- equal and bilateral and CO2 detector Tube secured with: Tape Dental Injury: Teeth and Oropharynx as per pre-operative assessment

## 2021-09-26 NOTE — Anesthesia Preprocedure Evaluation (Addendum)
Anesthesia Evaluation  Patient identified by MRN, date of birth, ID band Patient awake    Reviewed: Allergy & Precautions, NPO status , Patient's Chart, lab work & pertinent test results  History of Anesthesia Complications Negative for: history of anesthetic complications  Airway Mallampati: I  TM Distance: >3 FB Neck ROM: Full    Dental  (+) Dental Advisory Given, Teeth Intact   Pulmonary neg pulmonary ROS,    Pulmonary exam normal        Cardiovascular negative cardio ROS Normal cardiovascular exam     Neuro/Psych negative neurological ROS  negative psych ROS   GI/Hepatic negative GI ROS, Neg liver ROS,   Endo/Other  negative endocrine ROS  Renal/GU negative Renal ROS     Musculoskeletal negative musculoskeletal ROS (+)   Abdominal   Peds  Hematology negative hematology ROS (+)   Anesthesia Other Findings   Reproductive/Obstetrics  Fibroids                             Anesthesia Physical Anesthesia Plan  ASA: 1  Anesthesia Plan: General   Post-op Pain Management: Tylenol PO (pre-op) and Celebrex PO (pre-op)   Induction: Intravenous  PONV Risk Score and Plan: 3 and Treatment may vary due to age or medical condition, Ondansetron, Dexamethasone and Midazolam  Airway Management Planned: LMA  Additional Equipment: None  Intra-op Plan:   Post-operative Plan: Extubation in OR  Informed Consent: I have reviewed the patients History and Physical, chart, labs and discussed the procedure including the risks, benefits and alternatives for the proposed anesthesia with the patient or authorized representative who has indicated his/her understanding and acceptance.     Dental advisory given  Plan Discussed with: CRNA and Anesthesiologist  Anesthesia Plan Comments:        Anesthesia Quick Evaluation

## 2021-09-26 NOTE — Transfer of Care (Signed)
Immediate Anesthesia Transfer of Care Note  Patient: Dominique Hancock  Procedure(s) Performed: Procedure(s) (LRB): COLPOSCOPY (N/A) CONIZATION CERVIX WITH BIOPSY (N/A) DILATATION AND CURETTAGE (N/A)  Patient Location: PACU  Anesthesia Type: General  Level of Consciousness: awake, sedated, patient cooperative and responds to stimulation  Airway & Oxygen Therapy: Patient Spontanous Breathing and Patient connected to [place] 02 and soft FM   Post-op Assessment: Report given to PACU RN, Post -op Vital signs reviewed and stable and Patient moving all extremities  Post vital signs: Reviewed and stable  Complications: No apparent anesthesia complications

## 2021-09-26 NOTE — H&P (Signed)
Dominique Hancock is an 41 y.o. female. 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   DILATION AND CURETTAGE OF UTERUS     05-25-2007 @ [facility];;;       and per pt had D&C for termation 03/ 2022   IR ANGIOGRAM PELVIS SELECTIVE OR SUPRASELECTIVE  04/25/2020   IR ANGIOGRAM SELECTIVE EACH ADDITIONAL VESSEL  04/25/2020   IR ANGIOGRAM SELECTIVE EACH ADDITIONAL VESSEL  04/25/2020   IR EMBO TUMOR ORGAN ISCHEMIA INFARCT INC GUIDE ROADMAPPING  04/25/2020   IR RADIOLOGIST EVAL & MGMT  01/02/2020   IR RADIOLOGIST EVAL & MGMT  05/14/2020   IR RADIOLOGIST EVAL & MGMT  10/29/2020   IR US GUIDE VASC ACCESS RIGHT  04/25/2020    Family History  Problem Relation Age of Onset   Cancer Mother    High Cholesterol Mother    Diabetes Mother     Social History:  reports that she has never smoked. She has never used  smokeless tobacco. She reports current alcohol use. She reports that she does not use drugs.  Allergies: No Known Allergies  No medications prior to admission.    Review of Systems  All other systems reviewed and are negative.  Height 5\' 5"  (1.651 m), weight 71.7 kg, last menstrual period 09/14/2021. Physical Exam Constitutional:      Appearance: Normal appearance.  HENT:     Head: Atraumatic.     Mouth/Throat:     Mouth: Mucous membranes are moist.  Eyes:     Extraocular Movements: Extraocular movements intact.  Cardiovascular:     Heart sounds: Normal heart sounds.  Pulmonary:     Breath sounds: Normal breath sounds.  Abdominal:     Palpations: Abdomen is soft.  Genitourinary:    General: Normal vulva.  Musculoskeletal:        General: No swelling.     Cervical back: Neck supple.  Skin:    General: Skin is warm and dry.  Neurological:     Mental Status: She is alert and oriented to person, place, and time.  Psychiatric:        Mood and Affect: Mood normal.        Behavior: Behavior normal.    No results found for this or any previous visit (from the past 24 hour(s)).  No results found.  Assessment/Plan: Cin 3 (+) ECC P) colpo directed cold knife conization. Procedure  explained. Risk of surgery reviewed including infection, bleeding, injury to surrounding organ structures, incomplete resection of dysplasia, cervical incompetence, distortion of cervix  Dominique Hancock A Dominique Hancock 09/26/2021, 6:50 AM

## 2021-09-26 NOTE — Interval H&P Note (Signed)
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  The various methods of treatment have been discussed with the patient and family. After consideration of risks, benefits and other options for treatment, the patient has consented to  Procedure(s): COLPOSCOPY (N/A) CONIZATION CERVIX WITH BIOPSY (N/A) DILATATION AND CURETTAGE (N/A) as a surgical intervention.  The patient's history has been reviewed, patient examined, no change in status, stable for surgery.  I have reviewed the patient's chart and labs.  Questions were answered to the patient's satisfaction.     Dominique Hancock A Fantashia Shupert

## 2021-09-26 NOTE — Anesthesia Postprocedure Evaluation (Signed)
Anesthesia Post Note  Patient: Dominique Hancock  Procedure(s) Performed: COLPOSCOPY (Vagina ) CONIZATION CERVIX WITH BIOPSY (Vagina ) DILATATION AND CURETTAGE (Vagina )     Patient location during evaluation: PACU Anesthesia Type: General Level of consciousness: awake and alert Pain management: pain level controlled Vital Signs Assessment: post-procedure vital signs reviewed and stable Respiratory status: spontaneous breathing, nonlabored ventilation and respiratory function stable Cardiovascular status: stable and blood pressure returned to baseline Anesthetic complications: no   No notable events documented.  Last Vitals:  Vitals:   09/26/21 1200 09/26/21 1248  BP: 115/67 123/79  Pulse: 70 68  Resp: 14 14  Temp:    SpO2: 100% 100%    Last Pain:  Vitals:   09/26/21 1248  TempSrc:   PainSc: 0-No pain                 Audry Pili

## 2021-09-26 NOTE — Discharge Instructions (Addendum)
CALL  IF TEMP>100.4, NOTHING PER VAGINA X 3 WK, CALL IF SOAKING A MAXI  PAD EVERY HOUR OR MORE FREQUENTLY      NO TYLENOL PRODUCTS UNTIL AFTER 3:00 PM TODAY.  NO IBUPROFEN PRODUCTS (IBUPROFEN, ALEVE, ADVIL, MOTRIN, NAPROXEN) UNTIL AFTER 7:00 PM TODAY.     DISCHARGE INSTRUCTIONS: D&C The following instructions have been prepared to help you care for yourself upon your return home.   Personal hygiene:  Use sanitary pads for vaginal drainage, not tampons.  Shower the day after your procedure.  NO tub baths, pools or Jacuzzis for 2-3 weeks.  Wipe front to back after using the bathroom.  Activity and limitations:  Do NOT drive or operate any equipment for 24 hours. The effects of anesthesia are still present and drowsiness may result.  Do NOT rest in bed all day.  Walking is encouraged.  Walk up and down stairs slowly.  You may resume your normal activity in one to two days or as indicated by your physician.  Sexual activity: NO intercourse for at least 2 weeks after the procedure, or as indicated by your physician.  Diet: Eat a light meal as desired this evening. You may resume your usual diet tomorrow.  Return to work: You may resume your work activities in one to two days or as indicated by your doctor.  What to expect after your surgery: Expect to have vaginal bleeding/discharge for 2-3 days and spotting for up to 10 days. It is not unusual to have soreness for up to 1-2 weeks. You may have a slight burning sensation when you urinate for the first day. Mild cramps may continue for a couple of days. You may have a regular period in 2-6 weeks.  Call your doctor for any of the following:  Excessive vaginal bleeding, saturating and changing one pad every hour.  Inability to urinate 6 hours after discharge from hospital.  Pain not relieved by pain medication.  Fever of 100.4 F or greater.  Unusual vaginal discharge or odor.       Post Anesthesia Home Care  Instructions  Activity: Get plenty of rest for the remainder of the day. A responsible individual must stay with you for 24 hours following the procedure.  For the next 24 hours, DO NOT: -Drive a car -Paediatric nurse -Drink alcoholic beverages -Take any medication unless instructed by your physician -Make any legal decisions or sign important papers.  Meals: Start with liquid foods such as gelatin or soup. Progress to regular foods as tolerated. Avoid greasy, spicy, heavy foods. If nausea and/or vomiting occur, drink only clear liquids until the nausea and/or vomiting subsides. Call your physician if vomiting continues.  Special Instructions/Symptoms: Your throat may feel dry or sore from the anesthesia or the breathing tube placed in your throat during surgery. If this causes discomfort, gargle with warm salt water. The discomfort should disappear within 24 hours.

## 2021-09-27 NOTE — Op Note (Signed)
[name], Dominique Hancock MEDICAL RECORD NO: [id number] ACCOUNT NO: [id number] DATE OF BIRTH: [birth year] FACILITY: [facility] LOCATION: [facility] PHYSICIAN: Haleemah Buckalew A. Garwin Brothers, MD  Operative Report   DATE OF PROCEDURE: 09/26/2021  PREOPERATIVE DIAGNOSIS:  Severe cervical dysplasia, endocervical dysplasia with CIN 2.  PROCEDURE: colposcopy directed  Cold knife conization, endocervical curetting.  POSTOPERATIVE DIAGNOSIS:  Severe cervical dysplasia, endocervical dysplasia with CIN 2.  ANESTHESIA:  General.  SURGEON:  Ovie Cornelio A. Garwin Brothers, MD  ASSISTANT:  None.  DESCRIPTION OF PROCEDURE:  Under adequate general anesthesia, the patient was placed in the dorsal lithotomy position.  A weighted speculum and a Sims retractor was placed.  A large bulbous cervix was noted. 3% acetic acid was used to lavage the cervix.  0 Vicryl figure-of-eight sutures were placed on the 3 and 9 o'clock position paracervically.  The colposcope was then utilized and the cervix was inspected for evidence of dysplasia. Lugol solution was placed on the cervix thereafter.  1% lidocaine with epinephrine was injected intracervical circumferentially. Using a #11 blade, conization was performed with the base being removed using Mayo scissors.  The specimen unfortunately was split at the 12 o'clock position.  Therefore, the specimen was tagged at 6 o'clock.  A piece unoriented cervical was removed from the 12 oclock position in the canal.  The endocervical canal was scraped and the base was then fulgurated and Monsel solution with Surgicel was also placed and the paracervical sutures were tied in the midline.  The procedure was terminated by removing all instruments.  Specimen labeled cervical cone with the 6 o'clock position tagged and  Endocervical curetting were sent to Pathology.  ESTIMATED BLOOD LOSS:  2 mL.  COMPLICATIONS:  none.   CONDITION: The patient tolerated the procedure well and was transferred to recovery room  in stable condition.   MUK D: 09/26/2021 11:23:35 am T: 09/27/2021 1:04:00 am  JOB: [id number]/ [id number]

## 2021-09-29 ENCOUNTER — Encounter (HOSPITAL_BASED_OUTPATIENT_CLINIC_OR_DEPARTMENT_OTHER): Payer: Self-pay | Admitting: Obstetrics and Gynecology

## 2021-09-29 LAB — SURGICAL PATHOLOGY

## 2021-12-19 ENCOUNTER — Other Ambulatory Visit: Payer: Self-pay | Admitting: Obstetrics and Gynecology

## 2021-12-19 DIAGNOSIS — Z1231 Encounter for screening mammogram for malignant neoplasm of breast: Secondary | ICD-10-CM

## 2022-01-19 ENCOUNTER — Ambulatory Visit: Payer: Managed Care, Other (non HMO)

## 2022-01-26 ENCOUNTER — Ambulatory Visit
Admission: RE | Admit: 2022-01-26 | Discharge: 2022-01-26 | Disposition: A | Discharge status: Home / Self Care | Payer: Managed Care, Other (non HMO) | Source: Ambulatory Visit | Attending: Obstetrics and Gynecology | Admitting: Obstetrics and Gynecology

## 2022-01-26 DIAGNOSIS — Z1231 Encounter for screening mammogram for malignant neoplasm of breast: Secondary | ICD-10-CM

## 2022-02-05 ENCOUNTER — Ambulatory Visit (INDEPENDENT_AMBULATORY_CARE_PROVIDER_SITE_OTHER): Payer: Managed Care, Other (non HMO) | Admitting: Family Medicine

## 2022-02-05 ENCOUNTER — Encounter: Payer: Self-pay | Admitting: Family Medicine

## 2022-02-05 VITALS — BP 100/68 | HR 67 | Temp 98.9°F | Ht 65.5 in | Wt 159.0 lb

## 2022-02-05 DIAGNOSIS — E559 Vitamin D deficiency, unspecified: Secondary | ICD-10-CM | POA: Diagnosis not present

## 2022-02-05 DIAGNOSIS — Z862 Personal history of diseases of the blood and blood-forming organs and certain disorders involving the immune mechanism: Secondary | ICD-10-CM | POA: Diagnosis not present

## 2022-02-05 DIAGNOSIS — E782 Mixed hyperlipidemia: Secondary | ICD-10-CM

## 2022-02-05 DIAGNOSIS — F4321 Adjustment disorder with depressed mood: Secondary | ICD-10-CM

## 2022-02-05 DIAGNOSIS — Z1159 Encounter for screening for other viral diseases: Secondary | ICD-10-CM

## 2022-02-05 DIAGNOSIS — Z0000 Encounter for general adult medical examination without abnormal findings: Secondary | ICD-10-CM | POA: Diagnosis not present

## 2022-02-05 DIAGNOSIS — Z23 Encounter for immunization: Secondary | ICD-10-CM

## 2022-02-05 LAB — TSH: TSH: 2.01 u[IU]/mL (ref 0.35–5.50)

## 2022-02-05 LAB — CBC WITH DIFFERENTIAL/PLATELET
Basophils Absolute: 0 10*3/uL (ref 0.0–0.1)
Basophils Relative: 0.3 % (ref 0.0–3.0)
Eosinophils Absolute: 0 10*3/uL (ref 0.0–0.7)
Eosinophils Relative: 0.3 % (ref 0.0–5.0)
HCT: 34.7 % — ABNORMAL LOW (ref 36.0–46.0)
Hemoglobin: 11.2 g/dL — ABNORMAL LOW (ref 12.0–15.0)
Lymphocytes Relative: 43.1 % (ref 12.0–46.0)
Lymphs Abs: 2.5 10*3/uL (ref 0.7–4.0)
MCHC: 32.2 g/dL (ref 30.0–36.0)
MCV: 84.5 fl (ref 78.0–100.0)
Monocytes Absolute: 0.3 10*3/uL (ref 0.1–1.0)
Monocytes Relative: 5 % (ref 3.0–12.0)
Neutro Abs: 3 10*3/uL (ref 1.4–7.7)
Neutrophils Relative %: 51.3 % (ref 43.0–77.0)
Platelets: 319 10*3/uL (ref 150.0–400.0)
RBC: 4.11 Mil/uL (ref 3.87–5.11)
RDW: 17.3 % — ABNORMAL HIGH (ref 11.5–15.5)
WBC: 5.8 10*3/uL (ref 4.0–10.5)

## 2022-02-05 LAB — LIPID PANEL
Cholesterol: 274 mg/dL — ABNORMAL HIGH (ref 0–200)
HDL: 58.6 mg/dL (ref >39.00)
LDL Cholesterol: 198 mg/dL — ABNORMAL HIGH (ref 0–99)
NonHDL: 215.55
Total CHOL/HDL Ratio: 5
Triglycerides: 90 mg/dL (ref 0.0–149.0)
VLDL: 18 mg/dL (ref 0.0–40.0)

## 2022-02-05 LAB — BASIC METABOLIC PANEL
BUN: 8 mg/dL (ref 6–23)
CO2: 25 mEq/L (ref 19–32)
Calcium: 10.1 mg/dL (ref 8.4–10.5)
Chloride: 104 mEq/L (ref 96–112)
Creatinine, Ser: 0.86 mg/dL (ref 0.40–1.20)
GFR: 84.72 mL/min (ref >60.00)
Glucose, Bld: 83 mg/dL (ref 70–99)
Potassium: 4.4 mEq/L (ref 3.5–5.1)
Sodium: 136 mEq/L (ref 135–145)

## 2022-02-05 LAB — VITAMIN D 25 HYDROXY (VIT D DEFICIENCY, FRACTURES): VITD: 28.54 ng/mL — ABNORMAL LOW (ref 30.00–100.00)

## 2022-02-05 LAB — T4, FREE: Free T4: 0.83 ng/dL (ref 0.60–1.60)

## 2022-02-05 LAB — HEMOGLOBIN A1C: Hgb A1c MFr Bld: 5.6 % (ref 4.6–6.5)

## 2022-02-05 NOTE — Progress Notes (Signed)
Subjective:     Dominique Hancock is a 41 y.o. female and is here for a comprehensive physical exam. The patient reports dealing with the loss of her grandmother and other life stress.  Pt state she has been out of work for the last few wks b/c of it but her request for FMLA by her therapist was denied.  Pt is trying to practice self care.  States has been feeling ok otherwise.  R knee pain has improved.  Pt had pap yesterday.  Feels like iron may be low again.    Patient interested in tetanus shot.  Social History   Socioeconomic History   Marital status: Single    Spouse name: Not on file   Number of children: 1   Years of education: Not on file   Highest education level: Not on file  Occupational History   Not on file  Tobacco Use   Smoking status: Never   Smokeless tobacco: Never  Vaping Use   Vaping Use: Not on file  Substance and Sexual Activity   Alcohol use: Yes    Comment: occasional   Drug use: Never   Sexual activity: Not on file  Other Topics Concern   Not on file  Social History Narrative   Not on file   Social Determinants of Health   Financial Resource Strain: Not on file  Food Insecurity: Not on file  Transportation Needs: Not on file  Physical Activity: Not on file  Stress: Not on file  Social Connections: Not on file  Intimate Partner Violence: Not on file   Health Maintenance  Topic Date Due   HIV Screening  Never done   Hepatitis C Screening  Never done   TETANUS/TDAP  Never done   COVID-19 Vaccine (3 - Pfizer series) 02/21/2022 (Originally 04/02/2020)   INFLUENZA VACCINE  03/17/2022   PAP SMEAR-Modifier  02/05/2025   HPV VACCINES  Aged Out    The following portions of the patient's history were reviewed and updated as appropriate: allergies, current medications, past family history, past medical history, past social history, past surgical history, and problem list.  Review of Systems Pertinent items noted in HPI and remainder of  comprehensive ROS otherwise negative.   Objective:    BP 100/68 (BP Location: Left Arm, Patient Position: Sitting, Cuff Size: Normal)   Pulse 67   Temp 98.9 F (37.2 C) (Oral)   Ht 5' 5.5" (1.664 m)   Wt 159 lb (72.1 kg)   LMP 01/20/2022 (Exact Date)   SpO2 97%   BMI 26.06 kg/m  General appearance: alert, cooperative, and no distress Head: Normocephalic, without obvious abnormality, atraumatic Eyes: conjunctivae/corneas clear. PERRL, EOM's intact. Fundi benign. Ears: normal TM's and external ear canals both ears Nose: Nares normal. Septum midline. Mucosa normal. No drainage or sinus tenderness. Throat: lips, mucosa, and tongue normal; teeth and gums normal Neck: no adenopathy, no carotid bruit, no JVD, supple, symmetrical, trachea midline, and thyroid not enlarged, symmetric, no tenderness/mass/nodules Lungs: clear to auscultation bilaterally Heart: regular rate and rhythm, S1, S2 normal, no murmur, click, rub or gallop Abdomen: soft, non-tender; bowel sounds normal; no masses,  no organomegaly Extremities: extremities normal, atraumatic, no cyanosis or edema Pulses: 2+ and symmetric Skin: Skin color, texture, turgor normal. No rashes or lesions Lymph nodes: Cervical, supraclavicular, and axillary nodes normal. Neurologic: Alert and oriented X 3, normal strength and tone. Normal symmetric reflexes. Normal coordination and gait    Assessment:    Healthy female exam  with history of anemia.   Plan:    Anticipatory guidance given including wearing seatbelts, smoke detectors in the home, increasing physical activity, increasing p.o. intake of water and vegetables. -labs -Immunizations reviewed.  Tdap given this visit -Mammogram done 01/26/2022 -Colonoscopy not yet indicated 2/2 age -Pap up-to-date done yesterday 02/04/2022 with OB/GYN. -Next CPE in 1 year See After Visit Summary for Counseling Recommendations  - Plan: Basic metabolic panel, TSH, T4, Free, Hemoglobin  A1c  Grief -Continue counseling -Continue self-care -Continue to monitor  Need for Tdap vaccination -Tdap given this visit  Vitamin D deficiency - Plan: Vitamin D, 25-hydroxy  Mixed hyperlipidemia -Lifestyle modifications -Total cholesterol 231, HDL 55, LDL 155, triglycerides 102 on 02/09/2020 - Plan: Lipid panel  History of anemia - Plan: CBC with Differential/Platelet, Iron, TIBC and Ferritin Panel  Encounter for hepatitis C screening test for low risk patient - Plan: Hep C Antibody  Follow-up as needed  Abbe Amsterdam, MD

## 2022-02-05 NOTE — Addendum Note (Signed)
Addended byEncarnacion Slates on: 02/05/2022 09:46 AM   Modules accepted: Orders

## 2022-02-06 LAB — IRON,TIBC AND FERRITIN PANEL
%SAT: 6 % (calc) — ABNORMAL LOW (ref 16–45)
Ferritin: 5 ng/mL — ABNORMAL LOW (ref 16–154)
Iron: 23 ug/dL — ABNORMAL LOW (ref 40–190)
TIBC: 410 mcg/dL (calc) (ref 250–450)

## 2022-02-06 LAB — HM PAP SMEAR: HPV, high-risk: NEGATIVE

## 2022-02-06 LAB — HEPATITIS C ANTIBODY: Hepatitis C Ab: NONREACTIVE

## 2022-02-13 ENCOUNTER — Telehealth: Payer: Self-pay | Admitting: Family Medicine

## 2022-02-13 NOTE — Telephone Encounter (Signed)
Pt call and stated she want dr. Volanda Napoleon to give her a call back she have some question about last visit.

## 2022-02-16 ENCOUNTER — Other Ambulatory Visit: Payer: Self-pay | Admitting: Family Medicine

## 2022-02-16 DIAGNOSIS — E559 Vitamin D deficiency, unspecified: Secondary | ICD-10-CM

## 2022-02-16 MED ORDER — VITAMIN D (ERGOCALCIFEROL) 1.25 MG (50000 UNIT) PO CAPS: 50000.0000 [IU] | ORAL_CAPSULE | ORAL | 0 refills | Status: DC

## 2022-02-18 ENCOUNTER — Telehealth: Payer: Self-pay | Admitting: Family Medicine

## 2022-02-18 ENCOUNTER — Other Ambulatory Visit: Payer: Self-pay

## 2022-02-18 DIAGNOSIS — E782 Mixed hyperlipidemia: Secondary | ICD-10-CM

## 2022-02-18 NOTE — Telephone Encounter (Signed)
Patient is made aware. Verbalized understanding.

## 2022-02-18 NOTE — Telephone Encounter (Signed)
Pt is calling back and would like her blood work results

## 2022-02-20 NOTE — Telephone Encounter (Signed)
At last visit one of pt's other providers complete forms, but they were denied.  Pt was to contact company regarding appealing the denial.  Based on last visit would be more FMLA rather that short term disability.

## 2022-02-24 NOTE — Telephone Encounter (Signed)
Contacted patient to follow up in regards to the message below. Patient stated it was confusion. Patient did not ask any follow up or question.

## 2022-09-21 ENCOUNTER — Other Ambulatory Visit: Payer: Self-pay

## 2022-09-21 ENCOUNTER — Inpatient Hospital Stay (HOSPITAL_COMMUNITY)
Admission: EM | Admit: 2022-09-21 | Discharge: 2022-09-22 | Disposition: A | Discharge status: Home / Self Care | Payer: Managed Care, Other (non HMO) | Attending: Emergency Medicine | Admitting: Emergency Medicine

## 2022-09-21 DIAGNOSIS — N939 Abnormal uterine and vaginal bleeding, unspecified: Secondary | ICD-10-CM

## 2022-09-21 DIAGNOSIS — O036 Delayed or excessive hemorrhage following complete or unspecified spontaneous abortion: Secondary | ICD-10-CM | POA: Insufficient documentation

## 2022-09-21 DIAGNOSIS — O209 Hemorrhage in early pregnancy, unspecified: Secondary | ICD-10-CM | POA: Diagnosis present

## 2022-09-21 LAB — CBC
HCT: 27.7 % — ABNORMAL LOW (ref 36.0–46.0)
Hemoglobin: 9.1 g/dL — ABNORMAL LOW (ref 12.0–15.0)
MCH: 31.2 pg (ref 26.0–34.0)
MCHC: 32.9 g/dL (ref 30.0–36.0)
MCV: 94.9 fL (ref 80.0–100.0)
Platelets: 385 10*3/uL (ref 150–400)
RBC: 2.92 MIL/uL — ABNORMAL LOW (ref 3.87–5.11)
RDW: 12.5 % (ref 11.5–15.5)
WBC: 7.8 10*3/uL (ref 4.0–10.5)
nRBC: 0 % (ref 0.0–0.2)

## 2022-09-21 LAB — I-STAT BETA HCG BLOOD, ED (MC, WL, AP ONLY): I-stat hCG, quantitative: 6.5 m[IU]/mL — ABNORMAL HIGH (ref <5)

## 2022-09-21 NOTE — ED Notes (Signed)
Requested PA Gwyndolyn Saxon review chart for possible MAU transfer.

## 2022-09-21 NOTE — ED Triage Notes (Signed)
Pt via POV c/o vaginal bleeding. Having abortion pill on Dec 8th with [facility]. Had follow up US with remaining clotting. Has been clotting and bleeding since. On Tuesday, pt's vaginal bleeding worsening with large "tennis ball size" clot. Today states unknown how many pads as she has been sitting on toilet until bleeding "slows down." Currently spotting. Pt endorses dizziness/lightheaded started today. Pt endorses abdominal cramping. No fevers/chills. HX Anemia.

## 2022-09-22 ENCOUNTER — Inpatient Hospital Stay (HOSPITAL_COMMUNITY): Payer: Managed Care, Other (non HMO)

## 2022-09-22 MED ORDER — METHYLERGONOVINE MALEATE 0.2 MG PO TABS: 0.2000 mg | ORAL_TABLET | Freq: Four times a day (QID) | ORAL | 0 refills | Status: AC

## 2022-09-22 NOTE — MAU Note (Signed)
.  Dominique Hancock is a 42 y.o. at Unknown here in MAU reporting: here from main ED c/o heavy bleeding since last Tuesday - "blood was pouring out of me" and passed a large clot.. states she sat on the toiltet for close to an hour while blood was coming out. Has been heavy like a period from Wednesday to Sunday - was tapering off, but woke up this morning and bled through a pad - blood starting pouring out again like before and passed 4 medium-large size clots. [facility] since it has been past 30 days. Talked to Dr. Garwin Brothers and was told to be seen here. Reports cramping in lower abdomen and lower back.   Pain score: 5 Vitals:   09/22/22 0101 09/22/22 0144  BP: 109/71 121/60  Pulse: 65 70  Resp: 16 18  Temp: 98.1 F (36.7 C) 98.5 F (36.9 C)  SpO2: 99%      FHT:NA  Lab orders placed from triage:

## 2022-09-22 NOTE — ED Provider Triage Note (Signed)
Emergency Medicine Provider OB Triage Evaluation Note  Dominique Hancock is a 42 y.o. female, No obstetric history on file., at Unknown gestation who presents to the emergency department with complaints of vaginal bleeding going on since December had a medical abortion, noted that her bleeding has gotten worse since Tuesday, she is currently on birth control, states that she had a follow-up ultrasound after medical abortion and it was successful.  No history ovarian torsion's ectopic pregnancies..  Review of  Systems  Positive: Vaginal pain, vaginal bleeding Negative: Abdominal nausea vomiting  Physical Exam  BP 109/71 (BP Location: Right Arm)   Pulse 65   Temp 98.1 F (36.7 C) (Oral)   Resp 16   Ht 5' 5.5" (1.664 m)   Wt 68 kg   SpO2 99%   BMI 24.58 kg/m  General: Awake, no distress  HEENT: Atraumatic  Resp: Normal effort  Cardiac: Normal rate Abd: Nondistended, nontender  MSK: Moves all extremities without difficulty Neuro: Speech clear  Medical Decision Making  Pt evaluated for pregnancy concern and is stable for transfer to MAU. Pt is in agreement with plan for transfer.  1:20 AM Discussed with MAU APP, Ok Edwards, who accepts patient in transfer.  Clinical Impression  No diagnosis found.     Marcello Fennel, PA-C 09/22/22 0121

## 2022-09-22 NOTE — ED Provider Notes (Signed)
[facility] Provider Note   CSN: [id number] Arrival date & time: 09/21/22  6759     History  Chief Complaint  Patient presents with   Vaginal Bleeding    Dominique Hancock is a 42 y.o. female.  The history is provided by the patient.  Vaginal Bleeding Dominique Hancock is a 42 y.o. female who presents to the Emergency Department complaining of vaginal bleeding.  She reports having medical abortion on December 8 and has been experiencing intermittent bleeding since then.  On Tuesday of last week she had an episode of heavy bleeding with passing clots and then on Monday she passed 5 clots.  She is having difficulty describing how many pads she is needed as she needed to sit on the commode a lot for bleeding.  She has associated lower abdominal cramping.  She feels occasionally lightheaded.  No fever, nausea, vomiting.  She is followed by Dr. Garwin Brothers with OB/GYN.  She did have an ultrasound in January but the results of this are not available.     Home Medications Prior to Admission medications   Medication Sig Start Date End Date Taking? Authorizing Provider  methylergonovine (METHERGINE) 0.2 MG tablet Take 1 tablet (0.2 mg total) by mouth 4 (four) times daily for 3 days. 09/22/22 09/25/22 Yes Quintella Reichert, MD  cetirizine (ZYRTEC) 10 MG tablet Take 10 mg by mouth daily. As needed    [provider]  ferrous sulfate 325 (65 FE) MG tablet Take 325 mg by mouth daily with breakfast. Patient not taking: Reported on 02/05/2022    [provider]  fluocinonide ointment (LIDEX) 1.63 % Apply 1 application topically 3 (three) times a week.  05/27/20   [provider]  ibuprofen (ADVIL) 800 MG tablet Take 1 tablet (800 mg total) by mouth every 8 (eight) hours as needed for mild pain or moderate pain. Patient not taking: Reported on 02/05/2022 09/26/21   Servando Salina, MD  minoxidil (LONITEN) 2.5 MG tablet Take by  mouth daily.    [provider]  VITAMIN D PO Take 1 capsule by mouth daily.    [provider]  Vitamin D, Ergocalciferol, (DRISDOL) 1.25 MG (50000 UNIT) CAPS capsule Take 1 capsule (50,000 Units total) by mouth every 7 (seven) days. 02/16/22   Billie Ruddy, MD      Allergies    Patient has no allergy information on record.    Review of Systems   Review of Systems  Genitourinary:  Positive for vaginal bleeding.  All other systems reviewed and are negative.   Physical Exam Updated Vital Signs BP 107/67   Pulse 65   Temp 98 F (36.7 C) (Oral)   Resp 16   Ht '5\' 5"'$  (1.651 m)   Wt 70.8 kg   SpO2 100%   BMI 25.96 kg/m  Physical Exam Vitals and nursing note reviewed.  Constitutional:      Appearance: She is well-developed.  HENT:     Head: Normocephalic and atraumatic.  Cardiovascular:     Rate and Rhythm: Normal rate and regular rhythm.  Pulmonary:     Effort: Pulmonary effort is normal. No respiratory distress.  Abdominal:     Palpations: Abdomen is soft.     Tenderness: There is no abdominal tenderness. There is no guarding or rebound.  Genitourinary:    Comments: Small amount of blood in the vaginal vault with a small clot in the os.  There is no additional  active bleeding from the os. Musculoskeletal:        General: No tenderness.  Skin:    General: Skin is warm and dry.  Neurological:     Mental Status: She is alert and oriented to person, place, and time.  Psychiatric:        Behavior: Behavior normal.     ED Results / Procedures / Treatments   Labs (all labs ordered are listed, but only abnormal results are displayed) Labs Reviewed  CBC - Abnormal; Notable for the following components:      Result Value   RBC 2.92 (*)    Hemoglobin 9.1 (*)    HCT 27.7 (*)    All other components within normal limits  I-STAT BETA HCG BLOOD, ED ([facility], [facility], [facility] ONLY) - Abnormal; Notable for the following components:   I-stat hCG, quantitative 6.5 (*)     All other components within normal limits  TYPE AND SCREEN    EKG None  Radiology US OB LESS THAN 14 WEEKS WITH OB TRANSVAGINAL  Result Date: 09/22/2022 CLINICAL DATA:  [id number] with recent chemical induced abortion with vaginal bleeding. EXAM: OBSTETRIC <14 WK [place] AND TRANSVAGINAL OB US DOPPLER ULTRASOUND OF OVARIES TECHNIQUE: Both transabdominal and transvaginal ultrasound examinations were performed for complete evaluation of the gestation as well as the maternal uterus, adnexal regions, and pelvic cul-de-sac. Transvaginal technique was performed to assess early pregnancy. Color Doppler ultrasound was utilized to evaluate blood flow to the ovaries. COMPARISON:  Pelvic ultrasound 03/08/2020. No Ob ultrasound from this pregnancy. FINDINGS: Intrauterine gestational sac: None. Yolk sac:  Not Visualized. Embryo:  Not Visualized. Cardiac Activity: N/a. MSD: None. CRL: None. Subchorionic hemorrhage:  N/a. Maternal uterus/adnexae: The uterus is anteverted measuring 11 x 5.9 x 7.2 cm, volume 242.2 mL. There is myometrial heterogeneity consistent with a fibroid uterus. There is a hypoechoic subserosal fibroid of the dorsal uterus measuring 3 cm. There are a few others which are smaller. The cervix is closed and there is a small cervical nabothian cervical cyst. There is heterogeneous solid material within the uterus with small scattered fluid pockets and scattered color flow, with the endometrium measuring 2.9 cm AP. This is most likely due to retained products of conception in this clinical setting. A molar pregnancy could appear similar. Obstetric consult is recommended. The ovaries are sonographically unremarkable. There is trace anechoic fluid in the pelvic cul-de-sac. No adnexal masses seen. Color doppler evaluation of both ovaries demonstrates normal appearing ovarian color flow. IMPRESSION: 1. Heterogeneously echogenic solid material with scattered cystic pockets within the uterus and showing scattered  positive color flow. Findings most likely due to retained products of conception in this clinical setting. The cervix is closed. Infectious complication not excluded. A molar pregnancy is less likely but also possible. 2. Fibroid uterus. 3. No evidence of ovarian torsion. 4. Trace anechoic cul-de-sac fluid. Electronically Signed   By: Telford Nab M.D.   On: 09/22/2022 07:25    Procedures Procedures    Medications Ordered in ED Medications - No data to display  ED Course/ Medical Decision Making/ A&P                             Medical Decision Making Amount and/or Complexity of Data Reviewed Labs: ordered. Radiology: ordered.  Risk Prescription drug management.   Patient here for evaluation of persistent intermittent bleeding since medical AB on December 8.  I-STAT hCG is 6.5.  Her hemoglobin  is 9, this is slightly decreased when compared to priors in the system.  She is hemodynamically stable.  There is no evidence of massive hemorrhage on examination.  Pelvic ultrasound is concerning for potential retained products of conception.  No evidence of septic AP.  Discussed with Dr. Garwin Brothers with OB/GYN-recommendation to start Methergine with close in office follow-up and return precautions.        Final Clinical Impression(s) / ED Diagnoses Final diagnoses:  Vaginal bleeding    Rx / DC Orders ED Discharge Orders          Ordered    methylergonovine (METHERGINE) 0.2 MG tablet  4 times daily        09/22/22 0751              Quintella Reichert, MD 09/22/22 [phone]

## 2022-09-22 NOTE — MAU Provider Note (Signed)
CNM accepted an ED transfer for vaginal bleeding and passing clots. Non-pregnant GYN patient.   Event Date/Time  First Provider Initiated Contact with Patient 09/22/22 0155      S Ms. Dominique Hancock is a 42 y.o.  patient who presents to MAU today as a ED transfer with complaint of on-going vaginal bleeding. Patient states [facility] and has been experiencing vaginal bleeding ever since. She Patient states the bleeding had started to taper off but  tonight the bleeding became "heavier" and started passing "tennis-ball" sized clots.  She endorses saturating a pad and states its bled on to her sheets. She also notes some lower abdominal and back pain she describes as cramping. She currently rates pain a 5/10. Patient states she talked to Dr. Garwin Brothers and was recommended to present to the ED.   O BP 121/60 (BP Location: Right Arm)   Pulse 70   Temp 98.5 F (36.9 C) (Oral)   Resp 18   Ht '5\' 5"'$  (1.651 m)   Wt 70.8 kg   SpO2 99%   BMI 25.96 kg/m  Physical Exam Vitals and nursing note reviewed.  Constitutional:      General: She is not in acute distress.    Appearance: Normal appearance.  HENT:     Head: Normocephalic.  Pulmonary:     Effort: Pulmonary effort is normal.  Musculoskeletal:     Cervical back: Normal range of motion.  Skin:    General: Skin is warm and dry.  Neurological:     Mental Status: She is alert and oriented to person, place, and time.  Psychiatric:        Mood and Affect: Mood normal.     A Medical screening exam complete - After reviewing patient primary OB, CNM consulted Dr. Garwin Brothers on preference for treatment, and per MD patient should be seen in [facility].  - MSE Stable for transfer.  - Report provided to ED MD, and care accepted by ED physician.  - Patient escorted back to ED by NT.   P Discharge from MAU in stable condition  Deloris Ping, CNM 09/22/2022 1:55 AM

## 2022-09-22 NOTE — ED Notes (Signed)
Pt returns from MAU to complete evaluation in adults ED. Pt returns from triage to lobby at this time. No changes reported by Pt.

## 2022-09-23 ENCOUNTER — Encounter: Payer: Self-pay | Admitting: Family Medicine

## 2022-09-23 ENCOUNTER — Telehealth: Payer: Self-pay

## 2022-09-23 NOTE — Telephone Encounter (Signed)
Transition Care Management Follow-up Telephone Call Date of discharge and from where: [place] 09/22/2022 How have you been since you were released from the hospital? okay Any questions or concerns? No  Items Reviewed: Did the pt receive and understand the discharge instructions provided? Yes  Medications obtained and verified? Yes  Other? No  Any new allergies since your discharge? No  Dietary orders reviewed? Yes Do you have support at home? Yes   Home Care and Equipment/Supplies: Were home health services ordered? not applicable If so, what is the name of the agency? N/a  Has the agency set up a time to come to the patient's home? not applicable Were any new equipment or medical supplies ordered?  No What is the name of the medical supply agency? N/a Were you able to get the supplies/equipment? not applicable Do you have any questions related to the use of the equipment or supplies? No  Functional Questionnaire: (I = Independent and D = Dependent) ADLs: I  Bathing/Dressing- I  Meal Prep- I  Eating- I  Maintaining continence- I  Transferring/Ambulation- I  Managing Meds- I  Follow up appointments reviewed:  PCP Hospital f/u appt confirmed? Yes  Scheduled to see Grier Mitts on 09/24/2022 @ 9:30. [facility] f/u appt confirmed? No  Are transportation arrangements needed? No  If their condition worsens, is the pt aware to call PCP or go to the Emergency Dept.? Yes Was the patient provided with contact information for the PCP's office or ED? Yes Was to pt encouraged to call back with questions or concerns? Yes  Juanda Crumble, LPN [facility] [phone]

## 2022-09-23 NOTE — Telephone Encounter (Signed)
error 

## 2022-09-24 ENCOUNTER — Encounter: Payer: Self-pay | Admitting: Family Medicine

## 2022-09-24 ENCOUNTER — Ambulatory Visit: Payer: Managed Care, Other (non HMO) | Admitting: Family Medicine

## 2022-09-24 VITALS — BP 100/60 | HR 88 | Temp 98.8°F | Ht 65.0 in | Wt 152.0 lb

## 2022-09-24 DIAGNOSIS — O034 Incomplete spontaneous abortion without complication: Secondary | ICD-10-CM

## 2022-09-24 DIAGNOSIS — N939 Abnormal uterine and vaginal bleeding, unspecified: Secondary | ICD-10-CM | POA: Diagnosis not present

## 2022-09-24 LAB — CBC WITH DIFFERENTIAL/PLATELET
Basophils Absolute: 0 10*3/uL (ref 0.0–0.1)
Basophils Relative: 0.3 % (ref 0.0–3.0)
Eosinophils Absolute: 0 10*3/uL (ref 0.0–0.7)
Eosinophils Relative: 0.1 % (ref 0.0–5.0)
HCT: 30.9 % — ABNORMAL LOW (ref 36.0–46.0)
Hemoglobin: 10.7 g/dL — ABNORMAL LOW (ref 12.0–15.0)
Lymphocytes Relative: 25.3 % (ref 12.0–46.0)
Lymphs Abs: 2.2 10*3/uL (ref 0.7–4.0)
MCHC: 34.5 g/dL (ref 30.0–36.0)
MCV: 92.8 fl (ref 78.0–100.0)
Monocytes Absolute: 0.4 10*3/uL (ref 0.1–1.0)
Monocytes Relative: 4.8 % (ref 3.0–12.0)
Neutro Abs: 6 10*3/uL (ref 1.4–7.7)
Neutrophils Relative %: 69.5 % (ref 43.0–77.0)
Platelets: 452 10*3/uL — ABNORMAL HIGH (ref 150.0–400.0)
RBC: 3.33 Mil/uL — ABNORMAL LOW (ref 3.87–5.11)
RDW: 13.3 % (ref 11.5–15.5)
WBC: 8.6 10*3/uL (ref 4.0–10.5)

## 2022-09-24 LAB — HCG, QUANTITATIVE, PREGNANCY: Quantitative HCG: 3.98 m[IU]/mL

## 2022-09-24 NOTE — Progress Notes (Signed)
Established Patient Office Visit   Subjective  Patient ID: Dominique Hancock, female    DOB: November 06, 1980  Age: 42 y.o. MRN: [id number]  Chief Complaint  Patient presents with   Hospitalization Follow-up    Patient is a 42 year old [id number] female seen for ED f/u.  Pt seen in ED 2/5 for heavy menstrual bleeding with large clots.  Bleeding started a wk prior, intermittent.  Pt s/p MAB 12/8 in [place].  Denies regular menses since.  Pt with ongoing dizziness and fatigue.  In ED pelvic exam, labs, and ultrasound done.  Hemoglobin 9.1.  Methylergonovine maleate Rx'd given concern for RPOC.  Patient advised to follow-up with OB/GYN.  Seen by Dr. Garwin Brothers.  Patient has not started medication, states pharmacy did not have it in stock.  Inquires if still needs to take medication as planning to fly to [facility] on Saturday.      ROS Negative unless stated above    Objective:     BP 100/60 (BP Location: Left Arm, Patient Position: Sitting, Cuff Size: Normal)   Pulse 88   Temp 98.8 F (37.1 C) (Oral)   Ht 5' 5"$  (1.651 m)   Wt 152 lb (68.9 kg)   SpO2 100%   BMI 25.29 kg/m    Physical Exam Constitutional:      General: She is not in acute distress.    Appearance: Normal appearance.  HENT:     Head: Normocephalic and atraumatic.     Nose: Nose normal.     Mouth/Throat:     Mouth: Mucous membranes are moist.  Eyes:     Extraocular Movements: Extraocular movements intact.     Conjunctiva/sclera: Conjunctivae normal.     Pupils: Pupils are equal, round, and reactive to light.  Cardiovascular:     Rate and Rhythm: Normal rate and regular rhythm.     Heart sounds: Normal heart sounds. No murmur heard.    No gallop.  Pulmonary:     Effort: Pulmonary effort is normal. No respiratory distress.     Breath sounds: Normal breath sounds. No wheezing, rhonchi or rales.  Abdominal:     Palpations: Abdomen is soft.     Tenderness: There is no abdominal tenderness.  Skin:    General:  Skin is warm and dry.  Neurological:     Mental Status: She is alert and oriented to person, place, and time.      No results found for any visits on 09/24/22.    Assessment & Plan:  Retained products of conception following EAB -     CBC with Differential/Platelet -     Iron, TIBC and Ferritin Panel -     hCG, quantitative, pregnancy -     Ambulatory referral to Obstetrics / Gynecology  Abnormal uterine bleeding (AUB) -     CBC with Differential/Platelet -     Iron, TIBC and Ferritin Panel -     hCG, quantitative, pregnancy -     Ambulatory referral to Obstetrics / Gynecology  Transvaginal ultrasound in ED 09/22/2022: heterogeneous echogenic solid material with scattered cystic pockets within the uterus and showing scattered positive color-flow.  Findings most likely due to retained products of conception in this clinical setting.  The cervix is closed.  Infectious complications not excluded.  A molar pregnancy is less likely but also possible.  Fibroid uterus.  No evidence of ovarian torsion.  Trace anechoic cul-de-sac fluid.  Patient advised to pick up prescription for methylergonovine maleate 0.2 mg 4  times daily x 3 days and start medication.  Patient advised on need for follow-up with OB/GYN as D&C indicated for continued or worsening symptoms.  Referral placed.  Discussed at length potential r/b/a of RPOC including but not limited to continued bleeding, worsened anemia, infection, etc.  Given strict ED precautions.    Repeat CBC to assess for worsening anemia.  Discussed starting iron supplements.  No follow-ups on file.   Billie Ruddy, MD

## 2022-09-25 ENCOUNTER — Telehealth: Payer: Self-pay | Admitting: Family Medicine

## 2022-09-25 LAB — IRON,TIBC AND FERRITIN PANEL
%SAT: 29 % (calc) (ref 16–45)
Ferritin: 21 ng/mL (ref 16–154)
Iron: 116 ug/dL (ref 40–190)
TIBC: 406 mcg/dL (calc) (ref 250–450)

## 2022-09-25 NOTE — Telephone Encounter (Signed)
Fyi  Spoke to patient. She inform the pharmacy didn't have methergine yesterday and just have it today. Pt wants to know if Dr. Volanda Napoleon wants her to take it before the trip or after.  Brought attention to Dr. Volanda Napoleon. She advise for pt pick up the medication and to take it today and would be better idea if she doesn't go on the trip but will go by on how pt is feeling.   Inform pt of Dr. Volanda Napoleon' advise. She states she is feeling better and that she will pick up the medication and take it.

## 2022-09-25 NOTE — Telephone Encounter (Signed)
Pt call and stated the medication that you all talked about the pharmacy didn't have it but have it now and want to know what to do because she is traveling and want a call back.

## 2022-10-02 ENCOUNTER — Telehealth: Payer: Self-pay | Admitting: Family Medicine

## 2022-10-02 NOTE — Telephone Encounter (Signed)
Requesting a call to discuss return to work and medication

## 2022-10-06 NOTE — Telephone Encounter (Signed)
The [employer] form has been sent to dr banks s drive folder

## 2022-10-06 NOTE — Telephone Encounter (Signed)
Pt called to FU on this request for information and added that [facility] faxed over some documents for her and when she spoke to [facility], she was told they have yet to here from this office.   Pt is requesting a call back as soon as possible.

## 2022-10-07 NOTE — Telephone Encounter (Signed)
Pt call and stated she is still waiting for a call back to tell her what to do.

## 2022-10-07 NOTE — Telephone Encounter (Signed)
Please advise 

## 2022-10-08 DIAGNOSIS — Z0279 Encounter for issue of other medical certificate: Secondary | ICD-10-CM

## 2022-10-09 ENCOUNTER — Telehealth: Payer: Self-pay | Admitting: Family Medicine

## 2022-10-09 NOTE — Telephone Encounter (Signed)
Dominique Hancock with [name] is calling they are not in network with [employer] nor do they accept any new medicaid patients. Please referral pt to another obgyn

## 2022-10-12 NOTE — Telephone Encounter (Signed)
Pt requested to speak with Practice Admin. Returned call no answer LVM.

## 2022-10-12 NOTE — Telephone Encounter (Signed)
Pt has been out of work since Feb. 5 and she needs this paperwork completed and faxed to [facility] today or she will not get paid.   Pt is asking that we please send this completed paperwork ASAP!!  Pt has been waitng several weeks for a call back.

## 2022-10-13 NOTE — Telephone Encounter (Signed)
Left a message for the patient to return my call. Forms were faxed yesterday

## 2022-10-13 NOTE — Telephone Encounter (Signed)
Late entry:  Correction as documentation by K. Moyun, CMA inaccurate.  Provider informed  well after 5pm on 09/25/22 of pt calling inquiring if she she should pick up medication (methylergonovine) and start taking as she was going out of town.  Provider stated matter, including r/b/a, was addressed ad nauseam during recent appt.  Pt should pick up and take the medication as previously advised and she should not go on the trip.  Pt was also advised to make appt with OB/Gyn.

## 2022-10-14 ENCOUNTER — Telehealth: Payer: Self-pay | Admitting: Family Medicine

## 2022-10-14 NOTE — Telephone Encounter (Signed)
Shannin, RN with [employer]  [phone] Madaline Brilliant to leave a detailed message on this line  *Requesting Hemoglobin from 09/24/22 visit  Informed MD is OOO today.

## 2022-10-20 ENCOUNTER — Other Ambulatory Visit: Payer: Managed Care, Other (non HMO)

## 2022-10-20 NOTE — Telephone Encounter (Signed)
Re:  Referral for [facility] /   Please return Dominique Hancock's call:   [phone]   Calling with questions regarding the referral

## 2022-10-23 ENCOUNTER — Other Ambulatory Visit: Payer: Self-pay | Admitting: Obstetrics and Gynecology

## 2022-10-27 ENCOUNTER — Encounter (HOSPITAL_BASED_OUTPATIENT_CLINIC_OR_DEPARTMENT_OTHER): Payer: Self-pay | Admitting: Obstetrics and Gynecology

## 2022-10-30 ENCOUNTER — Ambulatory Visit (HOSPITAL_BASED_OUTPATIENT_CLINIC_OR_DEPARTMENT_OTHER)
Admission: RE | Admit: 2022-10-30 | Payer: Managed Care, Other (non HMO) | Source: Home / Self Care | Admitting: Obstetrics and Gynecology

## 2022-10-30 HISTORY — DX: Personal history of cervical dysplasia: Z87.410

## 2022-10-30 SURGERY — DILATATION & CURETTAGE/HYSTEROSCOPY WITH MYOSURE
Anesthesia: General

## 2022-11-06 NOTE — Telephone Encounter (Signed)
Ok to send if still needed.

## 2022-11-11 NOTE — Telephone Encounter (Signed)
Left a message for Shannon to return my call.

## 2022-11-12 NOTE — Telephone Encounter (Signed)
Left a message for the patient to return my call.  

## 2022-12-16 ENCOUNTER — Ambulatory Visit: Payer: Managed Care, Other (non HMO) | Admitting: Obstetrics and Gynecology

## 2023-11-03 ENCOUNTER — Other Ambulatory Visit: Payer: Self-pay | Admitting: Obstetrics and Gynecology

## 2023-11-03 DIAGNOSIS — Z1231 Encounter for screening mammogram for malignant neoplasm of breast: Secondary | ICD-10-CM

## 2023-11-04 ENCOUNTER — Ambulatory Visit
Admission: RE | Admit: 2023-11-04 | Discharge: 2023-11-04 | Discharge status: Home / Self Care | Source: Ambulatory Visit | Attending: Obstetrics and Gynecology | Admitting: Obstetrics and Gynecology

## 2023-11-04 DIAGNOSIS — Z1231 Encounter for screening mammogram for malignant neoplasm of breast: Secondary | ICD-10-CM

## 2023-11-08 ENCOUNTER — Ambulatory Visit (INDEPENDENT_AMBULATORY_CARE_PROVIDER_SITE_OTHER): Admitting: Family Medicine

## 2023-11-08 ENCOUNTER — Encounter: Payer: Self-pay | Admitting: Family Medicine

## 2023-11-08 VITALS — BP 124/82 | HR 57 | Temp 98.3°F | Ht 65.0 in | Wt 163.4 lb

## 2023-11-08 DIAGNOSIS — Z0000 Encounter for general adult medical examination without abnormal findings: Secondary | ICD-10-CM

## 2023-11-08 DIAGNOSIS — E559 Vitamin D deficiency, unspecified: Secondary | ICD-10-CM

## 2023-11-08 DIAGNOSIS — E782 Mixed hyperlipidemia: Secondary | ICD-10-CM

## 2023-11-08 LAB — COMPREHENSIVE METABOLIC PANEL
ALT: 19 U/L (ref 0–35)
AST: 41 U/L — ABNORMAL HIGH (ref 0–37)
Albumin: 4.7 g/dL (ref 3.5–5.2)
Alkaline Phosphatase: 59 U/L (ref 39–117)
BUN: 10 mg/dL (ref 6–23)
CO2: 27 meq/L (ref 19–32)
Calcium: 10.2 mg/dL (ref 8.4–10.5)
Chloride: 102 meq/L (ref 96–112)
Creatinine, Ser: 0.75 mg/dL (ref 0.40–1.20)
GFR: 98.62 mL/min (ref >60.00)
Glucose, Bld: 87 mg/dL (ref 70–99)
Potassium: 4.1 meq/L (ref 3.5–5.1)
Sodium: 136 meq/L (ref 135–145)
Total Bilirubin: 0.4 mg/dL (ref 0.2–1.2)
Total Protein: 8.5 g/dL — ABNORMAL HIGH (ref 6.0–8.3)

## 2023-11-08 LAB — T4, FREE: Free T4: 0.75 ng/dL (ref 0.60–1.60)

## 2023-11-08 LAB — CBC WITH DIFFERENTIAL/PLATELET
Basophils Absolute: 0 10*3/uL (ref 0.0–0.1)
Basophils Relative: 0.5 % (ref 0.0–3.0)
Eosinophils Absolute: 0 10*3/uL (ref 0.0–0.7)
Eosinophils Relative: 0.4 % (ref 0.0–5.0)
HCT: 32.8 % — ABNORMAL LOW (ref 36.0–46.0)
Hemoglobin: 10.6 g/dL — ABNORMAL LOW (ref 12.0–15.0)
Lymphocytes Relative: 39.2 % (ref 12.0–46.0)
Lymphs Abs: 2.6 10*3/uL (ref 0.7–4.0)
MCHC: 32.4 g/dL (ref 30.0–36.0)
MCV: 82.4 fl (ref 78.0–100.0)
Monocytes Absolute: 0.3 10*3/uL (ref 0.1–1.0)
Monocytes Relative: 4.9 % (ref 3.0–12.0)
Neutro Abs: 3.7 10*3/uL (ref 1.4–7.7)
Neutrophils Relative %: 55 % (ref 43.0–77.0)
Platelets: 326 10*3/uL (ref 150.0–400.0)
RBC: 3.98 Mil/uL (ref 3.87–5.11)
RDW: 16 % — ABNORMAL HIGH (ref 11.5–15.5)
WBC: 6.6 10*3/uL (ref 4.0–10.5)

## 2023-11-08 LAB — LIPID PANEL
Cholesterol: 307 mg/dL — ABNORMAL HIGH (ref 0–200)
HDL: 55.4 mg/dL (ref >39.00)
LDL Cholesterol: 222 mg/dL — ABNORMAL HIGH (ref 0–99)
NonHDL: 251.23
Total CHOL/HDL Ratio: 6
Triglycerides: 148 mg/dL (ref 0.0–149.0)
VLDL: 29.6 mg/dL (ref 0.0–40.0)

## 2023-11-08 LAB — TSH: TSH: 1.96 u[IU]/mL (ref 0.35–5.50)

## 2023-11-08 LAB — HEMOGLOBIN A1C: Hgb A1c MFr Bld: 5.8 % (ref 4.6–6.5)

## 2023-11-08 LAB — VITAMIN D 25 HYDROXY (VIT D DEFICIENCY, FRACTURES): VITD: 34.62 ng/mL (ref 30.00–100.00)

## 2023-11-08 NOTE — Progress Notes (Signed)
 Established Patient Office Visit   Subjective  Patient ID: Dominique Hancock, female    DOB: 11-Sep-1980  Age: 43 y.o. MRN: [id number]  No chief complaint on file.   Pt is a 43 yo female seen for CPE.  Patient states she has been doing well overall.  Taking supplements including red yeast rice to help with cholesterol.  Patient would like to avoid statins.  Reading labels and choosing foods with decreased saturated fats.  Patient notes her mother has a history of HLD.  Patient had Pap at [facility].  Patient exercising in the gym and playing pickle ball.    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   s/p cervical cold knife conization   Menorrhagia    Submucous uterine fibroid    Wears glasses    Past Surgical History:  Procedure Laterality Date   CERVICAL CONIZATION W/BX N/A 09/26/2021   Procedure: CONIZATION CERVIX WITH BIOPSY;  Surgeon: Maxie Better, MD;  Location: [facility];  Service: Gynecology;  Laterality: N/A;   COLPOSCOPY N/A 09/26/2021   Procedure: COLPOSCOPY;  Surgeon: Maxie Better, MD;  Location: [facility];  Service: Gynecology;  Laterality: N/A;   DILATATION & CURETTAGE/HYSTEROSCOPY WITH MYOSURE N/A 04/17/2019   Procedure: DILATATION & CURETTAGE/HYSTEROSCOPY WITH MYOSURE XL;  Surgeon: Maxie Better, MD;  Location: [facility];  Service: Gynecology;  Laterality: N/A;  Request 1 hr.   DILATION AND CURETTAGE OF UTERUS     05-25-2007 @ [facility];;;       and per pt had D&C for termation 03/ 2022   DILATION AND CURETTAGE OF UTERUS N/A 09/26/2021   Procedure: DILATATION AND CURETTAGE;  Surgeon: Maxie Better, MD;   Location: [facility];  Service: Gynecology;  Laterality: N/A;   IR ANGIOGRAM PELVIS SELECTIVE OR SUPRASELECTIVE  04/25/2020   IR ANGIOGRAM SELECTIVE EACH ADDITIONAL VESSEL  04/25/2020   IR ANGIOGRAM SELECTIVE EACH ADDITIONAL VESSEL  04/25/2020   IR EMBO TUMOR ORGAN ISCHEMIA INFARCT INC GUIDE ROADMAPPING  04/25/2020   IR RADIOLOGIST EVAL & MGMT  01/02/2020   IR RADIOLOGIST EVAL & MGMT  05/14/2020   IR RADIOLOGIST EVAL & MGMT  10/29/2020   IR US GUIDE VASC ACCESS RIGHT  04/25/2020   Social History   Tobacco Use   Smoking status: Never   Smokeless tobacco: Never  Substance Use Topics   Alcohol use: Yes    Comment: occasional   Drug use: Never   Family History  Problem Relation Age of Onset   Cancer Mother    High Cholesterol Mother    Diabetes Mother    Breast cancer Maternal Aunt 59   Not on File    ROS Negative unless stated above    Objective:     BP 124/82 (BP Location: Right Arm, Patient Position: Sitting, Cuff Size: Normal)   Pulse (!) 57   Temp 98.3 F (36.8 C) (Oral)   Ht 5\' 5"  (1.651 m)   Wt 163 lb 6.4 oz (74.1 kg)   LMP 11/07/2023 (Exact Date)   SpO2 97%   BMI 27.19 kg/m  BP Readings from Last 3 Encounters:  11/08/23 124/82  09/24/22 100/60  09/22/22 107/67   Wt Readings from Last 3 Encounters:  11/08/23 163 lb 6.4 oz (74.1 kg)  09/24/22 152 lb (68.9 kg)  09/22/22 156 lb (70.8 kg)      Physical Exam Constitutional:      Appearance: Normal appearance.  HENT:     Head: Normocephalic and atraumatic.     Right Ear: Tympanic membrane, ear canal and external ear normal.     Left Ear: Tympanic membrane, ear canal and external ear normal.     Nose: Nose normal.     Mouth/Throat:     Mouth: Mucous membranes are moist.     Pharynx: No oropharyngeal exudate or posterior oropharyngeal erythema.  Eyes:     General: No scleral icterus.    Extraocular Movements: Extraocular movements intact.     Conjunctiva/sclera: Conjunctivae normal.      Pupils: Pupils are equal, round, and reactive to light.  Neck:     Thyroid: No thyromegaly.  Cardiovascular:     Rate and Rhythm: Normal rate and regular rhythm.     Pulses: Normal pulses.     Heart sounds: Normal heart sounds. No murmur heard.    No friction rub.  Pulmonary:     Effort: Pulmonary effort is normal.     Breath sounds: Normal breath sounds. No wheezing, rhonchi or rales.  Abdominal:     General: Bowel sounds are normal.     Palpations: Abdomen is soft.     Tenderness: There is no abdominal tenderness.  Musculoskeletal:        General: No deformity. Normal range of motion.  Lymphadenopathy:     Cervical: No cervical adenopathy.  Skin:    General: Skin is warm and dry.     Findings: No lesion.  Neurological:     General: No focal deficit present.     Mental Status: She is alert and oriented to person, place, and time.  Psychiatric:        Mood and Affect: Mood normal.        Thought Content: Thought content normal.       11/08/2023    1:21 PM 09/24/2022   10:02 AM 02/05/2022    9:09 AM  Depression screen PHQ 2/9  Decreased Interest 0 1 3  Down, Depressed, Hopeless 0 0 3  PHQ - 2 Score 0 1 6  Altered sleeping 0  3  Tired, decreased energy 0  3  Change in appetite 0  2  Feeling bad or failure about yourself  0  0  Trouble concentrating 0  3  Moving slowly or fidgety/restless 0  2  Suicidal thoughts 0  0  PHQ-9 Score 0  19  Difficult doing work/chores Not difficult at all  Extremely dIfficult      11/08/2023    1:21 PM 02/09/2020   10:21 AM  GAD 7 : Generalized Anxiety Score  Nervous, Anxious, on Edge 0 0  Control/stop worrying 0 0  Worry too much - different things 0 0  Trouble relaxing 0 0  Restless 0 0  Easily annoyed or irritable 0 0  Afraid - awful might happen 0 0  Total GAD 7 Score 0 0  Anxiety Difficulty Not difficult at all     No results found for any visits on 11/08/23.    Assessment & Plan:  Well adult exam -     CBC with  Differential/Platelet; Future -     Comprehensive metabolic panel;  Future -     Hemoglobin A1c; Future -     Lipid panel; Future -     TSH; Future -     T4, free; Future  Mixed hyperlipidemia -     Comprehensive metabolic panel; Future -     Lipid panel; Future  Vitamin D deficiency -     VITAMIN D 25 Hydroxy (Vit-D Deficiency, Fractures); Future  Age-appropriate health screenings discussed.  Obtain labs.  Immunizations reviewed and up-to-date.  Pap done at [facility].  Patient encouraged to set up mammogram.  Continue lifestyle modifications including reading labels.  Consider fish oil tablets for cholesterol.  Return as needed.   Deeann Saint, MD

## 2023-11-26 ENCOUNTER — Encounter: Payer: Self-pay | Admitting: Family Medicine

## 2023-11-30 ENCOUNTER — Telehealth: Payer: Self-pay | Admitting: *Deleted

## 2023-11-30 NOTE — Telephone Encounter (Signed)
 Copied from CRM [id number]. Topic: General - Other >> Nov 29, 2023  2:44 PM Albertha Alosa wrote: Reason for CRM: Patient called in returning a missed call from A Leah, would like for her to give her a callback

## 2023-12-07 NOTE — Telephone Encounter (Signed)
 Called and spoke with patient, patient would like to take the fish oil and increasing her physical activity before adding the cholesterol medication

## 2024-01-03 DIAGNOSIS — F411 Generalized anxiety disorder: Secondary | ICD-10-CM | POA: Diagnosis not present

## 2024-01-03 DIAGNOSIS — Z658 Other specified problems related to psychosocial circumstances: Secondary | ICD-10-CM | POA: Diagnosis not present

## 2024-01-03 DIAGNOSIS — F4323 Adjustment disorder with mixed anxiety and depressed mood: Secondary | ICD-10-CM | POA: Diagnosis not present

## 2024-01-04 ENCOUNTER — Telehealth: Payer: Self-pay

## 2024-01-04 DIAGNOSIS — E782 Mixed hyperlipidemia: Secondary | ICD-10-CM

## 2024-01-04 DIAGNOSIS — Z862 Personal history of diseases of the blood and blood-forming organs and certain disorders involving the immune mechanism: Secondary | ICD-10-CM

## 2024-01-04 NOTE — Telephone Encounter (Signed)
 Copied from CRM [id number]. Topic: Clinical - Request for Lab/Test Order >> Jan 04, 2024  1:30 PM [place] J wrote: Reason for CRM: Patient is requesting an order for blood work. She stated that she would like to follow up from previous blood work done.  Please call back when that is completed.

## 2024-01-05 NOTE — Telephone Encounter (Signed)
 I believe that is all.

## 2024-02-22 DIAGNOSIS — F4323 Adjustment disorder with mixed anxiety and depressed mood: Secondary | ICD-10-CM | POA: Diagnosis not present

## 2024-02-22 DIAGNOSIS — F411 Generalized anxiety disorder: Secondary | ICD-10-CM | POA: Diagnosis not present

## 2024-02-22 DIAGNOSIS — Z658 Other specified problems related to psychosocial circumstances: Secondary | ICD-10-CM | POA: Diagnosis not present

## 2024-03-22 DIAGNOSIS — Z658 Other specified problems related to psychosocial circumstances: Secondary | ICD-10-CM | POA: Diagnosis not present

## 2024-03-22 DIAGNOSIS — F4323 Adjustment disorder with mixed anxiety and depressed mood: Secondary | ICD-10-CM | POA: Diagnosis not present

## 2024-03-22 DIAGNOSIS — F411 Generalized anxiety disorder: Secondary | ICD-10-CM | POA: Diagnosis not present

## 2024-04-26 DIAGNOSIS — Z658 Other specified problems related to psychosocial circumstances: Secondary | ICD-10-CM | POA: Diagnosis not present

## 2024-04-26 DIAGNOSIS — F4323 Adjustment disorder with mixed anxiety and depressed mood: Secondary | ICD-10-CM | POA: Diagnosis not present

## 2024-04-26 DIAGNOSIS — F411 Generalized anxiety disorder: Secondary | ICD-10-CM | POA: Diagnosis not present

## 2024-04-27 ENCOUNTER — Other Ambulatory Visit: Payer: Self-pay

## 2024-04-27 ENCOUNTER — Other Ambulatory Visit

## 2024-04-27 DIAGNOSIS — Z111 Encounter for screening for respiratory tuberculosis: Secondary | ICD-10-CM

## 2024-04-27 NOTE — Progress Notes (Signed)
 Patient dropped off Health Examation Certificate paperwork to be filled out patient need a PPD testing, order has been placed per Dr. Mercer

## 2024-04-30 LAB — QUANTIFERON-TB GOLD PLUS
Mitogen-NIL: >10 [IU]/mL
NIL: 0.02 [IU]/mL
QuantiFERON-TB Gold Plus: NEGATIVE
TB1-NIL: 0 [IU]/mL
TB2-NIL: 0 [IU]/mL

## 2024-05-03 ENCOUNTER — Ambulatory Visit: Payer: Self-pay | Admitting: Family Medicine

## 2024-06-20 DIAGNOSIS — Z658 Other specified problems related to psychosocial circumstances: Secondary | ICD-10-CM | POA: Diagnosis not present

## 2024-06-20 DIAGNOSIS — F4323 Adjustment disorder with mixed anxiety and depressed mood: Secondary | ICD-10-CM | POA: Diagnosis not present

## 2024-06-20 DIAGNOSIS — F411 Generalized anxiety disorder: Secondary | ICD-10-CM | POA: Diagnosis not present

## 2024-07-03 ENCOUNTER — Telehealth: Payer: Self-pay | Admitting: Family Medicine

## 2024-07-03 NOTE — Telephone Encounter (Signed)
 Patient dropped off document health examination certificate, to be filled out by provider. Patient requested to send it back via Call Patient to pick up within 5-days. Document is located in providers tray at front office.Please advise at Mobile [phone] (mobile)

## 2024-07-05 DIAGNOSIS — Z658 Other specified problems related to psychosocial circumstances: Secondary | ICD-10-CM | POA: Diagnosis not present

## 2024-07-05 DIAGNOSIS — F411 Generalized anxiety disorder: Secondary | ICD-10-CM | POA: Diagnosis not present

## 2024-07-05 DIAGNOSIS — F4323 Adjustment disorder with mixed anxiety and depressed mood: Secondary | ICD-10-CM | POA: Diagnosis not present

## 2024-07-18 DIAGNOSIS — Z0279 Encounter for issue of other medical certificate: Secondary | ICD-10-CM

## 2024-07-19 NOTE — Telephone Encounter (Signed)
 Form has been filled out and is ready for pick-up, patient is aware

## 2024-08-07 DIAGNOSIS — L6681 Central centrifugal cicatricial alopecia: Secondary | ICD-10-CM | POA: Diagnosis not present
